# Patient Record
Sex: Male | Born: 1935 | Race: White | Hispanic: No | Marital: Married | Smoking: Current every day smoker
Health system: Southern US, Community
[De-identification: ages and names within clinical notes are randomized; demographics above are authoritative.]

## PROBLEM LIST (undated history)

## (undated) DIAGNOSIS — I2510 Atherosclerotic heart disease of native coronary artery without angina pectoris: Secondary | ICD-10-CM

## (undated) DIAGNOSIS — I739 Peripheral vascular disease, unspecified: Secondary | ICD-10-CM

## (undated) DIAGNOSIS — E785 Hyperlipidemia, unspecified: Secondary | ICD-10-CM

## (undated) HISTORY — DX: Peripheral vascular disease, unspecified: I73.9

## (undated) HISTORY — DX: Hyperlipidemia, unspecified: E78.5

---

## 2016-04-19 ENCOUNTER — Other Ambulatory Visit: Payer: Self-pay | Admitting: *Deleted

## 2016-04-19 ENCOUNTER — Other Ambulatory Visit: Payer: Self-pay | Admitting: Vascular Surgery

## 2016-04-19 DIAGNOSIS — L97419 Non-pressure chronic ulcer of right heel and midfoot with unspecified severity: Secondary | ICD-10-CM

## 2016-05-24 ENCOUNTER — Encounter: Payer: Self-pay | Admitting: Vascular Surgery

## 2016-06-02 ENCOUNTER — Ambulatory Visit (INDEPENDENT_AMBULATORY_CARE_PROVIDER_SITE_OTHER): Payer: Medicare Other | Admitting: Vascular Surgery

## 2016-06-02 ENCOUNTER — Other Ambulatory Visit: Payer: Self-pay | Admitting: *Deleted

## 2016-06-02 ENCOUNTER — Ambulatory Visit (HOSPITAL_COMMUNITY)
Admission: RE | Admit: 2016-06-02 | Discharge: 2016-06-02 | Disposition: A | Discharge status: Home / Self Care | Payer: Medicare Other | Source: Ambulatory Visit | Attending: Vascular Surgery | Admitting: Vascular Surgery

## 2016-06-02 ENCOUNTER — Encounter: Payer: Self-pay | Admitting: Vascular Surgery

## 2016-06-02 ENCOUNTER — Other Ambulatory Visit: Payer: Self-pay

## 2016-06-02 VITALS — BP 150/78 | HR 71 | Temp 98.3°F | Resp 20 | Ht 72.0 in | Wt 150.0 lb

## 2016-06-02 DIAGNOSIS — E785 Hyperlipidemia, unspecified: Secondary | ICD-10-CM | POA: Insufficient documentation

## 2016-06-02 DIAGNOSIS — L97419 Non-pressure chronic ulcer of right heel and midfoot with unspecified severity: Secondary | ICD-10-CM | POA: Diagnosis not present

## 2016-06-02 DIAGNOSIS — F17290 Nicotine dependence, other tobacco product, uncomplicated: Secondary | ICD-10-CM | POA: Insufficient documentation

## 2016-06-02 DIAGNOSIS — I739 Peripheral vascular disease, unspecified: Secondary | ICD-10-CM

## 2016-06-02 DIAGNOSIS — I7025 Atherosclerosis of native arteries of other extremities with ulceration: Secondary | ICD-10-CM

## 2016-06-02 DIAGNOSIS — I10 Essential (primary) hypertension: Secondary | ICD-10-CM | POA: Insufficient documentation

## 2016-06-02 NOTE — Progress Notes (Signed)
Vascular and Vein Specialist of [facility]  Patient name: Jesse Lucas MRN: [id number] DOB: 12/26/1934 Sex: male  REASON FOR VISIT: chronic right heel wound, referred by Dr. Charlynne Pander  HPI: Jesse Lucas is a 82 y.o. male, who presents with 18 month history of a non healing wound to the right heel. This started after the patient's wife was scrubbing his foot to rid of dead skin. The patient has been seeing Dr. Pricilla Holm, DPM for the past year and has been getting it cleaned every three months. The wound has improved but has never healed. He denies any pain with his wound due to constant numbness.   The patient that he has had constant numbness and weakness in his legs for the past two years. He does not know what brought this on. He has had what seems like nerve conduction studies in the past. He is mostly sedentary but does walk to the bathroom and around the house. His wife says he never leaves the house. He does report some pain with his right hip. He also reports some pain in his right foot at night.   He denies any history of CAD, although he has had chest pain in the past. He has undergone an treadmill stress test in the past. The patient has undergone Urolift for management of his lower urinary tract symptoms secondary to BPH. He is unaware of any renal issues. His last creatinine from April 2017 was 0.78.   He does not have diabetes. He started smoking since 9 and quit at 42, but now smokes from a pipe. He denies any history of CVA. He is currently on ciprofloxacin for a UTI. He takes a daily aspirin. He is on a statin for hyperlipidemia.   Past medical history: Prostate issues, hypertension, arthritis SOCIAL HISTORY: Social History   Social History  . Marital status: Married    Spouse name: N/A  . Number of children: N/A  . Years of education: N/A   Occupational History  . Not on file.   Social History Main Topics  . Smoking status: Current Every Day Smoker    Types: Pipe    . Smokeless tobacco: Former Neurosurgeon     Comment: 3-4 pipes per day  . Alcohol use Not on file  . Drug use: Unknown  . Sexual activity: Not on file   Other Topics Concern  . Not on file   Social History Narrative  . No narrative on file    No Known Allergies  Current Outpatient Prescriptions  Medication Sig Dispense Refill  . acetaminophen (TYLENOL) 325 MG tablet Take 650 mg by mouth every 6 (six) hours as needed.    Marland Kitchen aspirin 81 MG chewable tablet Chew by mouth daily.    Marland Kitchen atorvastatin (LIPITOR) 10 MG tablet Take 10 mg by mouth daily.    . ciprofloxacin (CIPRO) 500 MG tablet Take 500 mg by mouth 2 (two) times daily.    . cyanocobalamin 1000 MCG tablet Take 1,000 mcg by mouth daily.    . silver sulfADIAZINE (SILVADENE) 1 % cream Apply 1 application topically daily.     No current facility-administered medications for this visit.     REVIEW OF SYSTEMS:   denotes positive finding,  denotes negative finding Cardiac  Comments:  Chest pain or chest pressure:    Shortness of breath upon exertion:    Short of breath when lying flat:    Irregular heart rhythm:        Vascular  Pain in calf, thigh, or hip brought on by ambulation: x   Pain in feet at night that wakes you up from your sleep:  x   Blood clot in your veins:    Leg swelling:         Pulmonary    Oxygen at home:    Productive cough:     Wheezing:         Neurologic    Sudden weakness in arms or legs:     Sudden numbness in arms or legs:     Sudden onset of difficulty speaking or slurred speech:    Temporary loss of vision in one eye:     Problems with dizziness:         Gastrointestinal    Blood in stool:     Vomited blood:         Genitourinary    Burning when urinating:     Blood in urine:        Psychiatric    Major depression:         Hematologic    Bleeding problems:    Problems with blood clotting too easily:        Skin    Rashes or ulcers:        Constitutional    Fever or  chills:      PHYSICAL EXAM: Vitals:   06/02/16 1456  BP: (!) 150/78  Pulse: 71  Resp: 20  Temp: 98.3 F (36.8 C)  TempSrc: Oral  SpO2: 96%  Weight: 150 lb (68 kg)  Height: 6' (1.829 m)    GENERAL:The patient is a well-nourished male, in no acute distress. The vital signs are documented above. HEENT: normocephalic, no abnormalities.  CARDIAC: There is a regular rate and rhythm. No carotid bruits. VASCULAR: 2+ femoral pulses bilaterally. 1-2+ left popliteal and dorsalis pedis pulse. Non palpable right popliteal or pedal pulses.  PULMONARY: Non labored respiratory effort. Lungs clear bilaterally.  ABDOMEN: Soft and non-tender. No pulsatile mass.  MUSCULOSKELETAL: Kyphosis.  NEUROLOGIC: No focal deficits. Decreased sensation to feet bilaterally.  SKIN: 1.5 x 1.5 cm wound to medial right heel with callous. No bone visible. PSYCHIATRIC: The patient has a normal affect.  DATA:  ABIs 06/02/16  R: 0.75 with monophasic posterior tibial and dorsalis waveforms, TBI 0.59 L: 0.83 with monophasic posterior tibial and dorsalis pedis waveforms, TBI: 0.81   MEDICAL ISSUES: Non healing wound right heel  The patient has been going to the podiatrist for a year with minimal improvement in his wound. He does have some moderate PAD according to his ABIs. Discussed proceeding with an arteriogram and possible intervention. The procedure, risks and benefits were discussed with the patient and wife. Discussed that he may possibly need a bypass procedure if his disease is not amenable to percutaneous intervention. They are willing to proceed. This will be scheduled for tomorrow for 08/17/2016 with Dr. Darrick Penna. A BMP will be ordered preoperatively to evaluate his creatinine given no recent labs.  Maris Berger, PA-C [facility] Special[facility] of [place] [phone]   Clinic MD: Fields  History and exam details as above. Patient is a palpable left dorsalis pedis pulse is absent pedal pulses in the  right foot. He has a nonhealing wound has been present for almost 18 months. I believe he needs an arteriogram for further evaluation for wound healing potential as well as possible intervention. Risks benefits possible, patient and procedure details of aortogram lower extremity runoff possible intervention were  explained the patient and his wife today. These include but are not limited to bleeding infection vessel injury contrast reaction. They understand and agree to proceed.  Chronic medical problems of arthritis are stable. He does have a history of some type of prostate issue and may have some urinary dysfunction as stated above we will check his serum creatinine had a time to make sure that he will not be at risk of contrast nephropathy.  Fabienne Bruns, MD [facility] of [place] Office: [phone] Pager: [phone]

## 2016-06-03 ENCOUNTER — Ambulatory Visit (HOSPITAL_COMMUNITY)
Admission: RE | Admit: 2016-06-03 | Discharge: 2016-06-03 | Disposition: A | Discharge status: Home / Self Care | Payer: Medicare Other | Source: Ambulatory Visit | Attending: Vascular Surgery | Admitting: Vascular Surgery

## 2016-06-03 ENCOUNTER — Telehealth: Payer: Self-pay | Admitting: Vascular Surgery

## 2016-06-03 ENCOUNTER — Encounter (HOSPITAL_COMMUNITY)
Admission: RE | Disposition: A | Discharge status: Home / Self Care | Payer: Self-pay | Source: Ambulatory Visit | Attending: Vascular Surgery

## 2016-06-03 DIAGNOSIS — N401 Enlarged prostate with lower urinary tract symptoms: Secondary | ICD-10-CM | POA: Diagnosis not present

## 2016-06-03 DIAGNOSIS — E785 Hyperlipidemia, unspecified: Secondary | ICD-10-CM | POA: Diagnosis not present

## 2016-06-03 DIAGNOSIS — I700 Atherosclerosis of aorta: Secondary | ICD-10-CM | POA: Insufficient documentation

## 2016-06-03 DIAGNOSIS — L97419 Non-pressure chronic ulcer of right heel and midfoot with unspecified severity: Secondary | ICD-10-CM | POA: Diagnosis not present

## 2016-06-03 DIAGNOSIS — I739 Peripheral vascular disease, unspecified: Secondary | ICD-10-CM | POA: Diagnosis present

## 2016-06-03 DIAGNOSIS — I10 Essential (primary) hypertension: Secondary | ICD-10-CM | POA: Insufficient documentation

## 2016-06-03 DIAGNOSIS — M1990 Unspecified osteoarthritis, unspecified site: Secondary | ICD-10-CM | POA: Insufficient documentation

## 2016-06-03 DIAGNOSIS — I70202 Unspecified atherosclerosis of native arteries of extremities, left leg: Secondary | ICD-10-CM | POA: Diagnosis not present

## 2016-06-03 DIAGNOSIS — N390 Urinary tract infection, site not specified: Secondary | ICD-10-CM | POA: Diagnosis not present

## 2016-06-03 DIAGNOSIS — F17210 Nicotine dependence, cigarettes, uncomplicated: Secondary | ICD-10-CM | POA: Insufficient documentation

## 2016-06-03 DIAGNOSIS — I70234 Atherosclerosis of native arteries of right leg with ulceration of heel and midfoot: Secondary | ICD-10-CM | POA: Diagnosis not present

## 2016-06-03 DIAGNOSIS — Z7982 Long term (current) use of aspirin: Secondary | ICD-10-CM | POA: Diagnosis not present

## 2016-06-03 HISTORY — PX: ABDOMINAL AORTOGRAM W/LOWER EXTREMITY: CATH118223

## 2016-06-03 LAB — POCT I-STAT, CHEM 8
BUN: 26 mg/dL — ABNORMAL HIGH (ref 6–20)
CALCIUM ION: 1.19 mmol/L (ref 1.15–1.40)
CHLORIDE: 105 mmol/L (ref 101–111)
Creatinine, Ser: 0.9 mg/dL (ref 0.61–1.24)
GLUCOSE: 120 mg/dL — AB (ref 65–99)
HCT: 33 % — ABNORMAL LOW (ref 39.0–52.0)
Hemoglobin: 11.2 g/dL — ABNORMAL LOW (ref 13.0–17.0)
Potassium: 3.4 mmol/L — ABNORMAL LOW (ref 3.5–5.1)
Sodium: 142 mmol/L (ref 135–145)
TCO2: 27 mmol/L (ref 0–100)

## 2016-06-03 SURGERY — ABDOMINAL AORTOGRAM W/LOWER EXTREMITY
Anesthesia: LOCAL

## 2016-06-03 MED ORDER — LIDOCAINE HCL (PF) 1 % IJ SOLN
INTRAMUSCULAR | Status: AC
  Filled 2016-06-03: qty 30

## 2016-06-03 MED ORDER — IODIXANOL 320 MG/ML IV SOLN
INTRAVENOUS | Status: DC | PRN
  Administered 2016-06-03: 117 mL via INTRA_ARTERIAL

## 2016-06-03 MED ORDER — ACETAMINOPHEN 325 MG PO TABS: 325.0000 mg | ORAL_TABLET | ORAL | Status: DC | PRN

## 2016-06-03 MED ORDER — LABETALOL HCL 5 MG/ML IV SOLN: 10.0000 mg | INTRAVENOUS | Status: DC | PRN

## 2016-06-03 MED ORDER — HEPARIN (PORCINE) IN NACL 2-0.9 UNIT/ML-% IJ SOLN
INTRAMUSCULAR | Status: DC | PRN
  Administered 2016-06-03: 1000 mL

## 2016-06-03 MED ORDER — SODIUM CHLORIDE 0.45 % IV SOLN
INTRAVENOUS | Status: DC
  Administered 2016-06-03: 500 mL via INTRAVENOUS

## 2016-06-03 MED ORDER — MORPHINE SULFATE (PF) 10 MG/ML IV SOLN: 2.0000 mg | INTRAVENOUS | Status: DC | PRN

## 2016-06-03 MED ORDER — SODIUM CHLORIDE 0.9 % IV SOLN
INTRAVENOUS | Status: DC
  Administered 2016-06-03: 10:00:00 via INTRAVENOUS

## 2016-06-03 MED ORDER — ACETAMINOPHEN 325 MG RE SUPP
325.0000 mg | RECTAL | Status: DC | PRN
  Filled 2016-06-03: qty 2

## 2016-06-03 MED ORDER — SODIUM CHLORIDE 0.45 % IV SOLN: INTRAVENOUS | Status: AC

## 2016-06-03 MED ORDER — METOPROLOL TARTRATE 5 MG/5ML IV SOLN: 2.0000 mg | INTRAVENOUS | Status: DC | PRN

## 2016-06-03 MED ORDER — LIDOCAINE HCL (PF) 1 % IJ SOLN
INTRAMUSCULAR | Status: DC | PRN
  Administered 2016-06-03: 10 mL

## 2016-06-03 MED ORDER — SODIUM CHLORIDE 0.45 % IV SOLN: INTRAVENOUS | Status: DC

## 2016-06-03 MED ORDER — ONDANSETRON HCL 4 MG/2ML IJ SOLN: 4.0000 mg | Freq: Four times a day (QID) | INTRAMUSCULAR | Status: DC | PRN

## 2016-06-03 MED ORDER — HYDRALAZINE HCL 20 MG/ML IJ SOLN: 5.0000 mg | INTRAMUSCULAR | Status: DC | PRN

## 2016-06-03 SURGICAL SUPPLY — 9 items
CATH OMNI FLUSH 5F 65CM (CATHETERS) ×2 IMPLANT
COVER PRB 48X5XTLSCP FOLD TPE (BAG) ×1 IMPLANT
COVER PROBE 5X48 (BAG) ×1
KIT PV (KITS) ×2 IMPLANT
SHEATH PINNACLE 5F 10CM (SHEATH) ×2 IMPLANT
SYR MEDRAD MARK V 150ML (SYRINGE) ×2 IMPLANT
TRANSDUCER W/STOPCOCK (MISCELLANEOUS) ×2 IMPLANT
TRAY PV CATH (CUSTOM PROCEDURE TRAY) ×2 IMPLANT
WIRE HITORQ VERSACORE ST 145CM (WIRE) ×2 IMPLANT

## 2016-06-03 NOTE — Telephone Encounter (Signed)
-----   Message from Sharee Pimple, RN sent at 06/03/2016 12:59 PM EDT ----- Regarding: cardiac clearance and then appt with Dr. Darrick Penna   ----- Message ----- From: Sherren Kerns, MD Sent: 06/03/2016  11:16 AM To: Vvs Charge Pool  Procedure: Aortogram with bilateral extremity runoff  Pt needs cardiology eval prior to considering PT bypass right leg.  Please have him see me in the office after cardiology eval.   Fabienne Bruns, MD [facility] of [place] Office: [phone] Pager: [phone]

## 2016-06-03 NOTE — Progress Notes (Signed)
Sheath removed prior to coming to cath holding. Bed rest begins at 1130

## 2016-06-03 NOTE — Op Note (Addendum)
Procedure: Aortogram with bilateral extremity runoff  Preoperative diagnosis: Nonhealing wound right foot  Postoperative diagnosis: Same  Anesthesia: Local  Operative findings: #1 patent aortoiliac femoral popliteal system bilaterally. 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 The patient was placed in supine position on the Angio table. Both groins were prepped and draped in usual sterile fashion. Local anesthesia was ensured of the left common femoral artery. Ultrasound was used to identify the left common femoral artery and femoral bifurcation. An introducer needle was then used to cannulate the left common femoral artery using ultrasound guidance. An 035 versacore wire and threaded up in the abdominal aorta under fluoroscopic guidance. A 5 French sheath was passed over the guidewire and left common femoral artery. This was thoroughly flushed with heparinized saline. A 5 French Omni Flush catheter was then advanced over the guidewire into the abdominal aorta and abdominal aortogram was obtained in AP projection. The left and right renal arteries are widely patent. The infrarenal abdominal aorta is calcified but widely patent. The left and right common internal and external iliac arteries are widely patent. Next the Omni Flush catheter was pulled down just above the aortic bifurcation to confirm the above findings. This was done with magnification. Next bilateral extremity runoff views were performed through the Omni Flush catheter.  In the left lower extremity, the left common femoral profunda femoris superficial femoral popliteal arteries are all widely patent. The posterior tibial peroneal arteries are occluded. The anterior tibial artery is patent all the way to level of the foot although it does have  multiple segments of 50-70% stenosis in its midportion.  In the right lower extremity, the right common femoral profunda femoris and superficial femoral arteries are widely patent. The right popliteal artery is patent. The anterior tibial and peroneal arteries are occluded. The posterior tibial artery is patent but has a short segment about 5 cm area of occlusion in its midportion. It is the single runoff vessel to the right foot.  This point the Omni Flush catheter was removed over a guidewire. The 5 [place] sheathpain with direct pressure. The patient tolerated procedure well and there were no complications.  Operative management: The patient has a very superficial wound on his right foot. Consideration will be given for continued local wound care and observation versus right popliteal to posterior tibial artery bypass. Although the patient has anatomy suitable a possible retrograde procedures I do not feel that his foot is threatened to the point of considering this as it would not be as durable as a bypass.  We will schedule him for cardiac risk stratification. If his heart is in reasonable shape we will do a popliteal to posterior tibial bypass on the right leg. His heart does not seem reasonable for operation then we will consider a retrograde procedure or continued local wound care.  Fabienne Bruns, MD [facility] of [place] Office: [phone] Pager: [phone]

## 2016-06-03 NOTE — Discharge Instructions (Signed)
Femoral Site Care °Refer to this sheet in the next few weeks. These instructions provide you with information about caring for yourself after your procedure. Your health care provider may also give you more specific instructions. Your treatment has been planned according to current medical practices, but problems sometimes occur. Call your health care provider if you have any problems or questions after your procedure. °What can I expect after the procedure? °After your procedure, it is typical to have the following: °· Bruising at the site that usually fades within 1-2 weeks. °· Blood collecting in the tissue (hematoma) that may be painful to the touch. It should usually decrease in size and tenderness within 1-2 weeks. °Follow these instructions at home: °· Take medicines only as directed by your health care provider. °· You may shower 24-48 hours after the procedure or as directed by your health care provider. Remove the bandage (dressing) and gently wash the site with plain soap and water. Pat the area dry with a clean towel. Do not rub the site, because this may cause bleeding. °· Do not take baths, swim, or use a hot tub until your health care provider approves. °· Check your insertion site every day for redness, swelling, or drainage. °· Do not apply powder or lotion to the site. °· Limit use of stairs to twice a day for the first 2-3 days or as directed by your health care provider. °· Do not squat for the first 2-3 days or as directed by your health care provider. °· Do not lift over 10 lb (4.5 kg) for 5 days after your procedure or as directed by your health care provider. °· Ask your health care provider when it is okay to: °¨ Return to work or school. °¨ Resume usual physical activities or sports. °¨ Resume sexual activity. °· Do not drive home if you are discharged the same day as the procedure. Have someone else drive you. °· You may drive 24 hours after the procedure unless otherwise instructed by  your health care provider. °· Do not operate machinery or power tools for 24 hours after the procedure or as directed by your health care provider. °· If your procedure was done as an outpatient procedure, which means that you went home the same day as your procedure, a responsible adult should be with you for the first 24 hours after you arrive home. °· Keep all follow-up visits as directed by your health care provider. This is important. °Contact a health care provider if: °· You have a fever. °· You have chills. °· You have increased bleeding from the site. Hold pressure on the site. °Get help right away if: °· You have unusual pain at the site. °· You have redness, warmth, or swelling at the site. °· You have drainage (other than a small amount of blood on the dressing) from the site. °· The site is bleeding, and the bleeding does not stop after 30 minutes of holding steady pressure on the site. °· Your leg or foot becomes pale, cool, tingly, or numb. °This information is not intended to replace advice given to you by your health care provider. Make sure you discuss any questions you have with your health care provider. °Document Released: 10/18/2013 Document Revised: 07/23/2015 Document Reviewed: 09/03/2013 °Elsevier Interactive Patient Education © 2017 Else[employer] Inc[employer] ° °

## 2016-06-03 NOTE — Interval H&P Note (Signed)
History and Physical Interval Note:  06/03/2016 10:43 AM  Jesse Lucas  has presented today for surgery, with the diagnosis of nonhealing right heel ulcer  The various methods of treatment have been discussed with the patient and family. After consideration of risks, benefits and other options for treatment, the patient has consented to  Procedure(s): Abdominal Aortogram w/Lower Extremity (N/A) as a surgical intervention .  The patient's history has been reviewed, patient examined, no change in status, stable for surgery.  I have reviewed the patient's chart and labs.  Questions were answered to the patient's satisfaction.     Fabienne Bruns

## 2016-06-03 NOTE — Telephone Encounter (Signed)
Left message inquiring of the patient has an established cardiologist outside the [facility] system. If the patient doesn't, I will find him someone close to him to perform the cardiac clearance.

## 2016-06-03 NOTE — H&P (View-Only) (Signed)
 Vascular and Vein Specialist of [place]  Patient name: Jesse Lucas MRN: [id number] DOB: 12/06/1934 Sex: male  REASON FOR VISIT: chronic right heel wound, referred by Dr. Michael Joyce  HPI: Jesse Lucas is a 81 y.o. male, who presents with 18 month history of a non healing wound to the right heel. This started after the patient's wife was scrubbing his foot to rid of dead skin. The patient has been seeing Dr. Tucker, DPM for the past year and has been getting it cleaned every three months. The wound has improved but has never healed. He denies any pain with his wound due to constant numbness.   The patient that he has had constant numbness and weakness in his legs for the past two years. He does not know what brought this on. He has had what seems like nerve conduction studies in the past. He is mostly sedentary but does walk to the bathroom and around the house. His wife says he never leaves the house. He does report some pain with his right hip. He also reports some pain in his right foot at night.   He denies any history of CAD, although he has had chest pain in the past. He has undergone an treadmill stress test in the past. The patient has undergone Urolift for management of his lower urinary tract symptoms secondary to BPH. He is unaware of any renal issues. His last creatinine from April 2017 was 0.78.   He does not have diabetes. He started smoking since 9 and quit at 42, but now smokes from a pipe. He denies any history of CVA. He is currently on ciprofloxacin for a UTI. He takes a daily aspirin. He is on a statin for hyperlipidemia.   Past medical history: Prostate issues, hypertension, arthritis SOCIAL HISTORY: Social History   Social History  . Marital status: Married    Spouse name: N/A  . Number of children: N/A  . Years of education: N/A   Occupational History  . Not on file.   Social History Main Topics  . Smoking status: Current Every Day Smoker    Types: Pipe    . Smokeless tobacco: Former User     Comment: 3-4 pipes per day  . Alcohol use Not on file  . Drug use: Unknown  . Sexual activity: Not on file   Other Topics Concern  . Not on file   Social History Narrative  . No narrative on file    No Known Allergies  Current Outpatient Prescriptions  Medication Sig Dispense Refill  . acetaminophen (TYLENOL) 325 MG tablet Take 650 mg by mouth every 6 (six) hours as needed.    . aspirin 81 MG chewable tablet Chew by mouth daily.    . atorvastatin (LIPITOR) 10 MG tablet Take 10 mg by mouth daily.    . ciprofloxacin (CIPRO) 500 MG tablet Take 500 mg by mouth 2 (two) times daily.    . cyanocobalamin 1000 MCG tablet Take 1,000 mcg by mouth daily.    . silver sulfADIAZINE (SILVADENE) 1 % cream Apply 1 application topically daily.     No current facility-administered medications for this visit.     REVIEW OF SYSTEMS:  [X] denotes positive finding, [ ] denotes negative finding Cardiac  Comments:  Chest pain or chest pressure:    Shortness of breath upon exertion:    Short of breath when lying flat:    Irregular heart rhythm:        Vascular      Pain in calf, thigh, or hip brought on by ambulation: x   Pain in feet at night that wakes you up from your sleep:  x   Blood clot in your veins:    Leg swelling:         Pulmonary    Oxygen at home:    Productive cough:     Wheezing:         Neurologic    Sudden weakness in arms or legs:     Sudden numbness in arms or legs:     Sudden onset of difficulty speaking or slurred speech:    Temporary loss of vision in one eye:     Problems with dizziness:         Gastrointestinal    Blood in stool:     Vomited blood:         Genitourinary    Burning when urinating:     Blood in urine:        Psychiatric    Major depression:         Hematologic    Bleeding problems:    Problems with blood clotting too easily:        Skin    Rashes or ulcers:        Constitutional    Fever or  chills:      PHYSICAL EXAM: Vitals:   06/02/16 1456  BP: (!) 150/78  Pulse: 71  Resp: 20  Temp: 98.3 F (36.8 C)  TempSrc: Oral  SpO2: 96%  Weight: 150 lb (68 kg)  Height: 6' (1.829 m)    GENERAL:The patient is a well-nourished male, in no acute distress. The vital signs are documented above. HEENT: normocephalic, no abnormalities.  CARDIAC: There is a regular rate and rhythm. No carotid bruits. VASCULAR: 2+ femoral pulses bilaterally. 1-2+ left popliteal and dorsalis pedis pulse. Non palpable right popliteal or pedal pulses.  PULMONARY: Non labored respiratory effort. Lungs clear bilaterally.  ABDOMEN: Soft and non-tender. No pulsatile mass.  MUSCULOSKELETAL: Kyphosis.  NEUROLOGIC: No focal deficits. Decreased sensation to feet bilaterally.  SKIN: 1.5 x 1.5 cm wound to medial right heel with callous. No bone visible. PSYCHIATRIC: The patient has a normal affect.  DATA:  ABIs 06/02/16  R: 0.75 with monophasic posterior tibial and dorsalis waveforms, TBI 0.59 L: 0.83 with monophasic posterior tibial and dorsalis pedis waveforms, TBI: 0.81   MEDICAL ISSUES: Non healing wound right heel  The patient has been going to the podiatrist for a year with minimal improvement in his wound. He does have some moderate PAD according to his ABIs. Discussed proceeding with an arteriogram and possible intervention. The procedure, risks and benefits were discussed with the patient and wife. Discussed that he may possibly need a bypass procedure if his disease is not amenable to percutaneous intervention. They are willing to proceed. This will be scheduled for tomorrow for 08/17/2016 with Dr. Chosen Geske. A BMP will be ordered preoperatively to evaluate his creatinine given no recent labs.  Kimberly Trinh, PA-C [facility] [place] [phone]   Clinic MD: Tzipora Mcinroy  History and exam details as above. Patient is a palpable left dorsalis pedis pulse is absent pedal pulses in the  right foot. He has a nonhealing wound has been present for almost 18 months. I believe he needs an arteriogram for further evaluation for wound healing potential as well as possible intervention. Risks benefits possible, patient and procedure details of aortogram lower extremity runoff possible intervention were   explained the patient and his wife today. These include but are not limited to bleeding infection vessel injury contrast reaction. They understand and agree to proceed.  Chronic medical problems of arthritis are stable. He does have a history of some type of prostate issue and may have some urinary dysfunction as stated above we will check his serum creatinine had a time to make sure that he will not be at risk of contrast nephropathy.  Abhijay Morriss, MD [facility] of [place] Office: [phone] Pager: [phone]  

## 2016-06-06 ENCOUNTER — Encounter (HOSPITAL_COMMUNITY): Payer: Self-pay | Admitting: Vascular Surgery

## 2016-06-06 NOTE — Telephone Encounter (Signed)
Spoke to patient's wife- no established cardiologist. Created referral to [facility] in [place].

## 2016-06-22 ENCOUNTER — Ambulatory Visit (INDEPENDENT_AMBULATORY_CARE_PROVIDER_SITE_OTHER): Payer: Medicare Other | Admitting: Cardiology

## 2016-06-22 ENCOUNTER — Encounter: Payer: Self-pay | Admitting: Cardiology

## 2016-06-22 ENCOUNTER — Encounter: Payer: Self-pay | Admitting: *Deleted

## 2016-06-22 VITALS — BP 146/76 | HR 83 | Ht 71.0 in | Wt 150.6 lb

## 2016-06-22 DIAGNOSIS — Z01810 Encounter for preprocedural cardiovascular examination: Secondary | ICD-10-CM

## 2016-06-22 DIAGNOSIS — R0989 Other specified symptoms and signs involving the circulatory and respiratory systems: Secondary | ICD-10-CM

## 2016-06-22 DIAGNOSIS — R0602 Shortness of breath: Secondary | ICD-10-CM | POA: Diagnosis not present

## 2016-06-22 DIAGNOSIS — I7025 Atherosclerosis of native arteries of other extremities with ulceration: Secondary | ICD-10-CM

## 2016-06-22 NOTE — Progress Notes (Signed)
Clinical Summary Jesse Lucas is a 82 y.o.male seen as new patient, referred by Dr Jesse Lucas for preoperative evaluation.    1. Preoperative evaluation - no known cardiac - no chest pain. Has had some SOB. Sedentary lifestyle due to chronic leg pain/weakness. Fatigue with just taking a bath, chronic over 3-4 years. Limited to <1 block due to leg weakness. Cannot walk up flight of stairs.    2. PAD - followed by vascular - history of nonhealing right foot wound - being considered for lower extremity bypass vs cath intervention    PMH 1. PAD 2. Hyperlipidemia    No Known Allergies   Current Outpatient Prescriptions  Medication Sig Dispense Refill  . acetaminophen (TYLENOL) 325 MG tablet Take 650 mg by mouth every 6 (six) hours as needed.    Marland Kitchen aspirin 81 MG chewable tablet Chew by mouth daily.    Marland Kitchen atorvastatin (LIPITOR) 10 MG tablet Take 10 mg by mouth daily.    . cyanocobalamin 1000 MCG tablet Take 1,000 mcg by mouth daily.    . silver sulfADIAZINE (SILVADENE) 1 % cream Apply 1 application topically daily.     No current facility-administered medications for this visit.    Facility-Administered Medications Ordered in Other Visits  Medication Dose Route Frequency Provider Last Rate Last Dose  . technetium tetrofosmin (TC-MYOVIEW) injection 10 millicurie  10 millicurie Intravenous Once PRN Jesse Southward, MD      . technetium tetrofosmin (TC-MYOVIEW) injection 30 millicurie  30 millicurie Intravenous Once PRN Jesse Southward, MD         Past Surgical History:  Procedure Laterality Date  . ABDOMINAL AORTOGRAM W/LOWER EXTREMITY N/A 06/03/2016   Procedure: Abdominal Aortogram w/Lower Extremity;  Surgeon: Jesse Kerns, MD;  Location: [facility] INVASIVE CV LAB;  Service: Cardiovascular;  Laterality: N/A;     No Known Allergies    Family History  Problem Relation Age of Onset  . Diabetes Mother   . Cancer Mother     lymph node  . Heart disease Father   . Aneurysm Father     . Heart attack Brother      Social History Jesse Lucas reports that he has been smoking Pipe.  He has quit using smokeless tobacco. Jesse Lucas has no alcohol history on file.   Review of Systems CONSTITUTIONAL: No weight loss, fever, chills, weakness or fatigue.  HEENT: Eyes: No visual loss, blurred vision, double vision or yellow sclerae.No hearing loss, sneezing, congestion, runny nose or sore throat.  SKIN: No rash or itching.  CARDIOVASCULAR: per HPI RESPIRATORY: No shortness of breath, cough or sputum.  GASTROINTESTINAL: No anorexia, nausea, vomiting or diarrhea. No abdominal pain or blood.  GENITOURINARY: No burning on urination, no polyuria NEUROLOGICAL: No headache, dizziness, syncope, paralysis, ataxia, numbness or tingling in the extremities. No change in bowel or bladder control.  MUSCULOSKELETAL: No muscle, back pain, joint pain or stiffness.  LYMPHATICS: No enlarged nodes. No history of splenectomy.  PSYCHIATRIC: No history of depression or anxiety.  ENDOCRINOLOGIC: No reports of sweating, cold or heat intolerance. No polyuria or polydipsia.  Marland Kitchen   Physical Examination Vitals:   06/22/16 0923 06/22/16 0928  BP: (!) 170/69 (!) 146/76  Pulse: 85 83   Filed Weights   06/22/16 0923  Weight: 150 lb 9.6 oz (68.3 kg)    Gen: resting comfortably, no acute distress HEENT: no scleral icterus, pupils equal round and reactive, no palptable cervical adenopathy,  CV: RRR, 2/6 systolic murmur  RUSB,. No jvd. Bilatearl carotid bruits Resp: Clear to auscultation bilaterally GI: abdomen is soft, non-tender, non-distended, normal bowel sounds, no hepatosplenomegaly MSK: extremities are warm, no edema.  Skin: warm, no rash Neuro:  no focal deficits Psych: appropriate affect   Diagnostic Studies 05/2015 echo FINDINGS:  LEFT VENTRICLE The left ventricular size is normal. There is normal left ventricular wall  thickness. Upper septal hypertrophy (sigmoid septum), normal  variant.  Turbulence in the LVOT noted but no significant gradient. Left ventricular  systolic function is normal. LV ejection fraction = 55-60%. Left ventricular  filling pattern is impaired. No segmental wall motion abnormalities seen in  the left ventricle. -  RIGHT VENTRICLE The right ventricle is normal in size and function.  LEFT ATRIUM The left atrial size is normal.  RIGHT ATRIUM  Right atrial size is normal. - AORTIC VALVE The aortic valve is trileaflet. There is aortic valve sclerosis. There is no  aortic regurgitation. - MITRAL VALVE The mitral valve leaflets appear normal. There is trace mitral regurgitation. - TRICUSPID VALVE Structurally normal tricuspid valve. There is trace tricuspid regurgitation.  There was insufficient TR detected to calculate RV systolic pressure.  Estimated right atrial pressure is 5 mmHg.. - PULMONIC VALVE The pulmonic valve is not well visualized. Trace pulmonic valvular  regurgitation. - ARTERIES The aortic root is normal size. - VENOUS Pulmonary venous flow pattern is normal. The IVC is normal in size with an  inspiratory collapse of greater then 50%, suggesting normal right atrial  pressure. - EFFUSION There is no pericardial effusion.     Assessment and Plan  1. Preoperative evaluation - he is being considered for high risk vascular surgery. Poor function capacity, cannot tolerate greater than 4 METs due to SOB and leg pains - we will obtain a lexiscan MPI to further risk stratify   2. Carotid bruits - order cartoid [place].     F/u pending stress results Jesse Lucas, M.D.

## 2016-06-22 NOTE — Patient Instructions (Signed)
Your physician recommends that you schedule a follow-up appointment in: TO BE DETERMINED AFTER TESTING  Your physician recommends that you continue on your current medications as directed. Please refer to the Current Medication list given to you today.  Your physician has requested that you have a lexiscan myoview. For further information please visit [email or web address]. Please follow instruction sheet, as given.  Your physician has requested that you have a carotid duplex. This test is an ultrasound of the carotid arteries in your neck. It looks at blood flow through these arteries that supply the brain with blood. Allow one hour for this exam. There are no restrictions or special instructions.  Thank you for choosing [facility]!!    

## 2016-06-24 ENCOUNTER — Encounter (HOSPITAL_COMMUNITY)
Admission: RE | Admit: 2016-06-24 | Discharge: 2016-06-24 | Disposition: A | Discharge status: Home / Self Care | Payer: Medicare Other | Source: Ambulatory Visit | Attending: Cardiology | Admitting: Cardiology

## 2016-06-24 ENCOUNTER — Ambulatory Visit (HOSPITAL_COMMUNITY)
Admission: RE | Admit: 2016-06-24 | Discharge: 2016-06-24 | Disposition: A | Discharge status: Home / Self Care | Payer: Medicare Other | Source: Ambulatory Visit | Attending: Cardiology | Admitting: Cardiology

## 2016-06-24 ENCOUNTER — Ambulatory Visit (HOSPITAL_BASED_OUTPATIENT_CLINIC_OR_DEPARTMENT_OTHER)
Admission: RE | Admit: 2016-06-24 | Discharge: 2016-06-24 | Disposition: A | Discharge status: Home / Self Care | Payer: Medicare Other | Source: Ambulatory Visit | Attending: Cardiology | Admitting: Cardiology

## 2016-06-24 ENCOUNTER — Encounter (HOSPITAL_COMMUNITY): Payer: Self-pay

## 2016-06-24 DIAGNOSIS — R0602 Shortness of breath: Secondary | ICD-10-CM | POA: Insufficient documentation

## 2016-06-24 DIAGNOSIS — I6523 Occlusion and stenosis of bilateral carotid arteries: Secondary | ICD-10-CM | POA: Diagnosis not present

## 2016-06-24 DIAGNOSIS — R9439 Abnormal result of other cardiovascular function study: Secondary | ICD-10-CM | POA: Diagnosis not present

## 2016-06-24 DIAGNOSIS — R0989 Other specified symptoms and signs involving the circulatory and respiratory systems: Secondary | ICD-10-CM

## 2016-06-24 LAB — NM MYOCAR MULTI W/SPECT W/WALL MOTION / EF
CHL CUP NUCLEAR SDS: 4
CHL CUP NUCLEAR SRS: 17
LV dias vol: 85 mL (ref 62–150)
LVSYSVOL: 45 mL
Peak HR: 85 {beats}/min
RATE: 0.29
Rest HR: 61 {beats}/min
SSS: 21
TID: 0.92

## 2016-06-24 MED ORDER — REGADENOSON 0.4 MG/5ML IV SOLN
INTRAVENOUS | Status: AC
  Administered 2016-06-24: 0.4 mg via INTRAVENOUS
  Filled 2016-06-24: qty 5

## 2016-06-24 MED ORDER — SODIUM CHLORIDE 0.9% FLUSH
INTRAVENOUS | Status: AC
  Administered 2016-06-24: 10 mL via INTRAVENOUS
  Filled 2016-06-24: qty 10

## 2016-06-24 MED ORDER — TECHNETIUM TC 99M TETROFOSMIN IV KIT
30.0000 | PACK | Freq: Once | INTRAVENOUS | Status: AC | PRN
  Administered 2016-06-24: 30 via INTRAVENOUS

## 2016-06-24 MED ORDER — TECHNETIUM TC 99M TETROFOSMIN IV KIT
10.0000 | PACK | Freq: Once | INTRAVENOUS | Status: AC | PRN
  Administered 2016-06-24: 11 via INTRAVENOUS

## 2016-06-28 ENCOUNTER — Telehealth: Payer: Self-pay | Admitting: Vascular Surgery

## 2016-06-28 ENCOUNTER — Encounter: Payer: Self-pay | Admitting: *Deleted

## 2016-06-28 ENCOUNTER — Telehealth: Payer: Self-pay | Admitting: *Deleted

## 2016-06-28 NOTE — Telephone Encounter (Signed)
-----   Message from Sharee Pimple, RN sent at 06/28/2016  2:02 PM EDT ----- Regarding: needs appt Appt with Dr. Darrick Penna as soon as Dr. Allyson Sabal does cardiac workup ----- Message ----- From: Sherren Kerns, MD Sent: 06/28/2016   8:45 AM To: Vvs Charge Pool  Pt needs appt after cath  Leonette Most

## 2016-06-28 NOTE — Telephone Encounter (Signed)
-----   Message from Antoine Poche, MD sent at 06/27/2016  1:29 PM EDT ----- Stress test is abnormal, suggesting he has blocakges in the arteries of his heart. This may be causing some of his fatigue.  He needs to have a left heart cath done. Let him know this is similar to the recent procedure they did to look at the arteries of his legs, but instead will look at the arteries of his heart. We will forward result to his vascular surgeon as well.   Dina Rich MD

## 2016-06-28 NOTE — Telephone Encounter (Signed)
Sched appt 07/07/16 at 9:45. Spoke to pt to inform them of appt.

## 2016-06-28 NOTE — Telephone Encounter (Signed)
Pt agreeable and scheduled for 07/01/16 with Dr Herbie Baltimore at 1:30pm - pt voiced understanding of instructions - letter sent to pt

## 2016-06-29 ENCOUNTER — Other Ambulatory Visit: Payer: Self-pay | Admitting: Cardiology

## 2016-06-29 DIAGNOSIS — R0789 Other chest pain: Secondary | ICD-10-CM

## 2016-07-01 ENCOUNTER — Encounter (HOSPITAL_COMMUNITY)
Admission: RE | Disposition: A | Discharge status: Home / Self Care | Payer: Self-pay | Source: Ambulatory Visit | Attending: Cardiology

## 2016-07-01 ENCOUNTER — Ambulatory Visit (HOSPITAL_COMMUNITY)
Admission: RE | Admit: 2016-07-01 | Discharge: 2016-07-01 | Disposition: A | Discharge status: Home / Self Care | Payer: Medicare Other | Source: Ambulatory Visit | Attending: Cardiology | Admitting: Cardiology

## 2016-07-01 DIAGNOSIS — F17290 Nicotine dependence, other tobacco product, uncomplicated: Secondary | ICD-10-CM | POA: Insufficient documentation

## 2016-07-01 DIAGNOSIS — I2510 Atherosclerotic heart disease of native coronary artery without angina pectoris: Secondary | ICD-10-CM | POA: Insufficient documentation

## 2016-07-01 DIAGNOSIS — I2582 Chronic total occlusion of coronary artery: Secondary | ICD-10-CM | POA: Diagnosis not present

## 2016-07-01 DIAGNOSIS — Z01810 Encounter for preprocedural cardiovascular examination: Secondary | ICD-10-CM

## 2016-07-01 DIAGNOSIS — E785 Hyperlipidemia, unspecified: Secondary | ICD-10-CM | POA: Diagnosis not present

## 2016-07-01 DIAGNOSIS — I739 Peripheral vascular disease, unspecified: Secondary | ICD-10-CM | POA: Diagnosis not present

## 2016-07-01 DIAGNOSIS — R9439 Abnormal result of other cardiovascular function study: Secondary | ICD-10-CM | POA: Diagnosis present

## 2016-07-01 DIAGNOSIS — M79606 Pain in leg, unspecified: Secondary | ICD-10-CM | POA: Insufficient documentation

## 2016-07-01 DIAGNOSIS — I2584 Coronary atherosclerosis due to calcified coronary lesion: Secondary | ICD-10-CM | POA: Insufficient documentation

## 2016-07-01 DIAGNOSIS — Z7982 Long term (current) use of aspirin: Secondary | ICD-10-CM | POA: Diagnosis not present

## 2016-07-01 DIAGNOSIS — R0789 Other chest pain: Secondary | ICD-10-CM

## 2016-07-01 DIAGNOSIS — G8929 Other chronic pain: Secondary | ICD-10-CM | POA: Insufficient documentation

## 2016-07-01 HISTORY — PX: LEFT HEART CATH AND CORONARY ANGIOGRAPHY: CATH118249

## 2016-07-01 LAB — BASIC METABOLIC PANEL
Anion gap: 10 (ref 5–15)
BUN: 30 mg/dL — AB (ref 6–20)
CALCIUM: 9.1 mg/dL (ref 8.9–10.3)
CHLORIDE: 103 mmol/L (ref 101–111)
CO2: 28 mmol/L (ref 22–32)
CREATININE: 1.58 mg/dL — AB (ref 0.61–1.24)
GFR, EST AFRICAN AMERICAN: 46 mL/min — AB (ref >60)
GFR, EST NON AFRICAN AMERICAN: 40 mL/min — AB (ref >60)
Glucose, Bld: 112 mg/dL — ABNORMAL HIGH (ref 65–99)
Potassium: 3.7 mmol/L (ref 3.5–5.1)
SODIUM: 141 mmol/L (ref 135–145)

## 2016-07-01 LAB — CBC
HCT: 34.3 % — ABNORMAL LOW (ref 39.0–52.0)
Hemoglobin: 11.1 g/dL — ABNORMAL LOW (ref 13.0–17.0)
MCH: 29.3 pg (ref 26.0–34.0)
MCHC: 32.4 g/dL (ref 30.0–36.0)
MCV: 90.5 fL (ref 78.0–100.0)
PLATELETS: 216 10*3/uL (ref 150–400)
RBC: 3.79 MIL/uL — AB (ref 4.22–5.81)
RDW: 13.6 % (ref 11.5–15.5)
WBC: 6.9 10*3/uL (ref 4.0–10.5)

## 2016-07-01 LAB — PROTIME-INR
INR: 1.07
PROTHROMBIN TIME: 14 s (ref 11.4–15.2)

## 2016-07-01 SURGERY — LEFT HEART CATH AND CORONARY ANGIOGRAPHY
Anesthesia: LOCAL

## 2016-07-01 MED ORDER — VERAPAMIL HCL 2.5 MG/ML IV SOLN
INTRAVENOUS | Status: DC | PRN
  Administered 2016-07-01: 10 mL via INTRA_ARTERIAL

## 2016-07-01 MED ORDER — SODIUM CHLORIDE 0.9 % IV SOLN: INTRAVENOUS | Status: DC

## 2016-07-01 MED ORDER — SODIUM CHLORIDE 0.9% FLUSH: 3.0000 mL | INTRAVENOUS | Status: DC | PRN

## 2016-07-01 MED ORDER — HYDRALAZINE HCL 20 MG/ML IJ SOLN
INTRAMUSCULAR | Status: AC
  Filled 2016-07-01: qty 1

## 2016-07-01 MED ORDER — LIDOCAINE HCL (PF) 1 % IJ SOLN
INTRAMUSCULAR | Status: DC | PRN
  Administered 2016-07-01: 2 mL

## 2016-07-01 MED ORDER — HEPARIN (PORCINE) IN NACL 2-0.9 UNIT/ML-% IJ SOLN
INTRAMUSCULAR | Status: DC | PRN
  Administered 2016-07-01: 1000 mL

## 2016-07-01 MED ORDER — HEPARIN (PORCINE) IN NACL 2-0.9 UNIT/ML-% IJ SOLN
INTRAMUSCULAR | Status: AC
  Filled 2016-07-01: qty 500

## 2016-07-01 MED ORDER — IOPAMIDOL (ISOVUE-370) INJECTION 76%
INTRAVENOUS | Status: AC
  Filled 2016-07-01: qty 100

## 2016-07-01 MED ORDER — HEPARIN SODIUM (PORCINE) 1000 UNIT/ML IJ SOLN
INTRAMUSCULAR | Status: DC | PRN
  Administered 2016-07-01: 4000 [IU] via INTRAVENOUS

## 2016-07-01 MED ORDER — HYDRALAZINE HCL 20 MG/ML IJ SOLN
INTRAMUSCULAR | Status: DC | PRN
  Administered 2016-07-01 (×2): 10 mg via INTRAVENOUS

## 2016-07-01 MED ORDER — ASPIRIN 81 MG PO CHEW
CHEWABLE_TABLET | ORAL | Status: AC
  Administered 2016-07-01: 81 mg via ORAL
  Filled 2016-07-01: qty 1

## 2016-07-01 MED ORDER — SODIUM CHLORIDE 0.9% FLUSH: 3.0000 mL | Freq: Two times a day (BID) | INTRAVENOUS | Status: DC

## 2016-07-01 MED ORDER — SODIUM CHLORIDE 0.9 % IV SOLN
250.0000 mL | INTRAVENOUS | Status: DC | PRN
Start: 2016-07-01 — End: 2016-07-02

## 2016-07-01 MED ORDER — LIDOCAINE HCL (PF) 1 % IJ SOLN
INTRAMUSCULAR | Status: AC
  Filled 2016-07-01: qty 30

## 2016-07-01 MED ORDER — SODIUM CHLORIDE 0.9 % IV SOLN: 250.0000 mL | INTRAVENOUS | Status: DC | PRN

## 2016-07-01 MED ORDER — SODIUM CHLORIDE 0.9 % WEIGHT BASED INFUSION: 1.0000 mL/kg/h | INTRAVENOUS | Status: DC

## 2016-07-01 MED ORDER — VERAPAMIL HCL 2.5 MG/ML IV SOLN
INTRAVENOUS | Status: AC
  Filled 2016-07-01: qty 2

## 2016-07-01 MED ORDER — IOPAMIDOL (ISOVUE-370) INJECTION 76%
INTRAVENOUS | Status: DC | PRN
  Administered 2016-07-01: 60 mL via INTRA_ARTERIAL

## 2016-07-01 MED ORDER — HEPARIN SODIUM (PORCINE) 1000 UNIT/ML IJ SOLN
INTRAMUSCULAR | Status: AC
  Filled 2016-07-01: qty 1

## 2016-07-01 MED ORDER — ASPIRIN 81 MG PO CHEW
81.0000 mg | CHEWABLE_TABLET | ORAL | Status: AC
  Administered 2016-07-01: 81 mg via ORAL

## 2016-07-01 MED ORDER — SODIUM CHLORIDE 0.9 % WEIGHT BASED INFUSION
3.0000 mL/kg/h | INTRAVENOUS | Status: AC
  Administered 2016-07-01: 3 mL/kg/h via INTRAVENOUS

## 2016-07-01 MED ORDER — ONDANSETRON HCL 4 MG/2ML IJ SOLN: 4.0000 mg | Freq: Four times a day (QID) | INTRAMUSCULAR | Status: DC | PRN

## 2016-07-01 MED ORDER — HEPARIN (PORCINE) IN NACL 2-0.9 UNIT/ML-% IJ SOLN
INTRAMUSCULAR | Status: AC
  Filled 2016-07-01: qty 1000

## 2016-07-01 SURGICAL SUPPLY — 10 items
CATH INFINITI 5FR ANG PIGTAIL (CATHETERS) ×2 IMPLANT
CATH OPTITORQUE TIG 4.0 5F (CATHETERS) ×2 IMPLANT
DEVICE RAD COMP TR BAND LRG (VASCULAR PRODUCTS) ×2 IMPLANT
GLIDESHEATH SLEND A-KIT 6F 22G (SHEATH) ×2 IMPLANT
GUIDEWIRE INQWIRE 1.5J.035X260 (WIRE) ×1 IMPLANT
INQWIRE 1.5J .035X260CM (WIRE) ×2
KIT HEART LEFT (KITS) ×2 IMPLANT
PACK CARDIAC CATHETERIZATION (CUSTOM PROCEDURE TRAY) ×2 IMPLANT
TRANSDUCER W/STOPCOCK (MISCELLANEOUS) ×2 IMPLANT
TUBING CIL FLEX 10 FLL-RA (TUBING) ×2 IMPLANT

## 2016-07-01 NOTE — Brief Op Note (Signed)
   BRIEF CARDIAC CATHETERIZATION REPORT  Referring Cardiologist: Dr. Roderic Palau branch Vascular Surgeon: Dr. Juanda Crumble fields  07/01/2016.  6:49 PM  PATIENT:  Jesse Lucas  82 y.o. male with peripheral vascular disease who is an undergoing evaluation for possible per thoracic surgery. For preoperative evaluation had a cardiac stress test that was read as high risk. He now presents for invasive evaluation.  PRE-OPERATIVE DIAGNOSIS:  abnormal stress test  POST-OPERATIVE DIAGNOSIS:  Severe 2 vessel CAD with extensive severe lesions throughout the entire RCA mid to distal vessel and likely chronically occluded major OM 3 branches of the circumflex with bridging collaterals. Is also diffuse moderate disease in the LAD.  Elevated LVEDP of 22 mmHg in the setting of severe systemic hypertension.  PROCEDURE:  Procedure(s): Left Heart Cath and Coronary Angiography (N/A)   6 French right radial sheath placed using Angiocath micropuncture Kit.  TIG 4.0 catheter use for left and right coronary angiography as well as left ventriculography as well as hemodynamics.  Catheter was then removed without body over wire without palpitations  Sheath removed with TR been applied   SURGEON:  Surgeon(s) and Role:    * Leonie Man, MD - Primary  ANESTHESIA:   local lidocaine 3 mL  EBL:  <20 mL  MEDICATIONS USED: Radial cocktail (3 mg verapamil in 10 mL NS), IV hydralazine total of 20 mg   COUNTS:  YES  TR Band:  1845 hrs., 15 mL air.  DICTATION: .Note written in [place]: Discharge to home after PACU  PATIENT DISPOSITION:  PACU - hemodynamically stable.   Glenetta Hew, M.D., M.S. Interventional Cardiologist   Pager # [phone] Phone # [phone] [address] [place], [place] [address]

## 2016-07-01 NOTE — Interval H&P Note (Signed)
History and Physical Interval Note:  07/01/2016 5:57 PM  Jesse Lucas  has presented today for surgery, with the diagnosis of abnormal stress test.  The various methods of treatment have been discussed with the patient and family. After consideration of risks, benefits and other options for treatment, the patient has consented to  Procedure(s): Left Heart Cath and Coronary Angiography (N/A) as a surgical intervention .  The patient's history has been reviewed, patient examined, no change in status, stable for surgery.  I have reviewed the patient's chart and labs.  Questions were answered to the patient's satisfaction.    Cath Lab Visit (complete for each Cath Lab visit)  Clinical Evaluation Leading to the Procedure:   ACS: No.  Non-ACS:    Anginal Classification: No Symptoms; Pre-op for PV Operation  Anti-ischemic medical therapy: Minimal Therapy (1 class of medications)  Non-Invasive Test Results: High-risk stress test findings: cardiac mortality >3%/year  Prior CABG: No previous CABG    Jesse Lucas

## 2016-07-01 NOTE — H&P (View-Only) (Signed)
Clinical Summary Mr. Antuna is a 82 y.o.male seen as new patient, referred by Dr Darrick PennaFields for preoperative evaluation.    1. Preoperative evaluation - no known cardiac - no chest pain. Has had some SOB. Sedentary lifestyle due to chronic leg pain/weakness. Fatigue with just taking a bath, chronic over 3-4 years. Limited to <1 block due to leg weakness. Cannot walk up flight of stairs.    2. PAD - followed by vascular - history of nonhealing right foot wound - being considered for lower extremity bypass vs cath intervention    PMH 1. PAD 2. Hyperlipidemia    No Known Allergies   Current Outpatient Prescriptions  Medication Sig Dispense Refill  . acetaminophen (TYLENOL) 325 MG tablet Take 650 mg by mouth every 6 (six) hours as needed.    Marland Kitchen. aspirin 81 MG chewable tablet Chew by mouth daily.    Marland Kitchen. atorvastatin (LIPITOR) 10 MG tablet Take 10 mg by mouth daily.    . cyanocobalamin 1000 MCG tablet Take 1,000 mcg by mouth daily.    . silver sulfADIAZINE (SILVADENE) 1 % cream Apply 1 application topically daily.     No current facility-administered medications for this visit.    Facility-Administered Medications Ordered in Other Visits  Medication Dose Route Frequency Provider Last Rate Last Dose  . technetium tetrofosmin (TC-MYOVIEW) injection 10 millicurie  10 millicurie Intravenous Once PRN Ulyses SouthwardMark Boles, MD      . technetium tetrofosmin (TC-MYOVIEW) injection 30 millicurie  30 millicurie Intravenous Once PRN Ulyses SouthwardMark Boles, MD         Past Surgical History:  Procedure Laterality Date  . ABDOMINAL AORTOGRAM W/LOWER EXTREMITY N/A 06/03/2016   Procedure: Abdominal Aortogram w/Lower Extremity;  Surgeon: Sherren Kernsharles E Fields, MD;  [facility];  Service: Cardiovascular;  Laterality: N/A;     No Known Allergies    Family History  Problem Relation Age of Onset  . Diabetes Mother   . Cancer Mother     lymph node  . Heart disease Father   . Aneurysm Father     . Heart attack Brother      Social History Mr. Gittings reports that he has been smoking Pipe.  He has quit using smokeless tobacco. Mr. Raynelle Janriddy has no alcohol history on file.   Review of Systems CONSTITUTIONAL: No weight loss, fever, chills, weakness or fatigue.  HEENT: Eyes: No visual loss, blurred vision, double vision or yellow sclerae.No hearing loss, sneezing, congestion, runny nose or sore throat.  SKIN: No rash or itching.  CARDIOVASCULAR: per HPI RESPIRATORY: No shortness of breath, cough or sputum.  GASTROINTESTINAL: No anorexia, nausea, vomiting or diarrhea. No abdominal pain or blood.  GENITOURINARY: No burning on urination, no polyuria NEUROLOGICAL: No headache, dizziness, syncope, paralysis, ataxia, numbness or tingling in the extremities. No change in bowel or bladder control.  MUSCULOSKELETAL: No muscle, back pain, joint pain or stiffness.  LYMPHATICS: No enlarged nodes. No history of splenectomy.  PSYCHIATRIC: No history of depression or anxiety.  ENDOCRINOLOGIC: No reports of sweating, cold or heat intolerance. No polyuria or polydipsia.  Marland Kitchen.   Physical Examination Vitals:   06/22/16 0923 06/22/16 0928  BP: (!) 170/69 (!) 146/76  Pulse: 85 83   Filed Weights   06/22/16 0923  Weight: 150 lb 9.6 oz (68.3 kg)    Gen: resting comfortably, no acute distress HEENT: no scleral icterus, pupils equal round and reactive, no palptable cervical adenopathy,  CV: RRR, 2/6 systolic murmur  RUSB,. No jvd. Bilatearl carotid bruits Resp: Clear to auscultation bilaterally GI: abdomen is soft, non-tender, non-distended, normal bowel sounds, no hepatosplenomegaly MSK: extremities are warm, no edema.  Skin: warm, no rash Neuro:  no focal deficits Psych: appropriate affect   Diagnostic Studies 05/2015 echo FINDINGS:  LEFT VENTRICLE The left ventricular size is normal. There is normal left ventricular wall  thickness. Upper septal hypertrophy (sigmoid septum), normal  variant.  Turbulence in the LVOT noted but no significant gradient. Left ventricular  systolic function is normal. LV ejection fraction = 55-60%. Left ventricular  filling pattern is impaired. No segmental wall motion abnormalities seen in  the left ventricle. -  RIGHT VENTRICLE The right ventricle is normal in size and function.  LEFT ATRIUM The left atrial size is normal.  RIGHT ATRIUM  Right atrial size is normal. - AORTIC VALVE The aortic valve is trileaflet. There is aortic valve sclerosis. There is no  aortic regurgitation. - MITRAL VALVE The mitral valve leaflets appear normal. There is trace mitral regurgitation. - TRICUSPID VALVE Structurally normal tricuspid valve. There is trace tricuspid regurgitation.  There was insufficient TR detected to calculate RV systolic pressure.  Estimated right atrial pressure is 5 mmHg.. - PULMONIC VALVE The pulmonic valve is not well visualized. Trace pulmonic valvular  regurgitation. - ARTERIES The aortic root is normal size. - VENOUS Pulmonary venous flow pattern is normal. The IVC is normal in size with an  inspiratory collapse of greater then 50%, suggesting normal right atrial  pressure. - EFFUSION There is no pericardial effusion.     Assessment and Plan  1. Preoperative evaluation - he is being considered for high risk vascular surgery. Poor function capacity, cannot tolerate greater than 4 METs due to SOB and leg pains - we will obtain a lexiscan MPI to further risk stratify   2. Carotid bruits - order cartoid [place].     F/u pending stress results Antoine Poche, M.D.

## 2016-07-01 NOTE — Discharge Instructions (Signed)

## 2016-07-01 NOTE — Progress Notes (Signed)
Nigel MormonKim [place] / cath lab called, per Dr Herbie BaltimoreHarding pt to have increase to fluids as written. 75 cc increase to current  fluids till cath time// now running at 143 ml/hr.

## 2016-07-04 ENCOUNTER — Encounter (HOSPITAL_COMMUNITY): Payer: Self-pay | Admitting: Cardiology

## 2016-07-04 ENCOUNTER — Telehealth: Payer: Self-pay | Admitting: *Deleted

## 2016-07-04 NOTE — Telephone Encounter (Signed)
-----   Message from Antoine PocheJonathan F Branch, MD sent at 07/04/2016 11:38 AM EDT ----- Pt needs to f/u with me in 2 weeks   Dominga FerryJ Branch MD

## 2016-07-04 NOTE — Telephone Encounter (Signed)
Pt aware of 5/18 appt with Dr branch

## 2016-07-04 NOTE — Research (Signed)
OPTIMIZE Informed Consent   Subject Name: Jesse Lucas  Subject met inclusion and exclusion criteria.  The informed consent form, study requirements and expectations were reviewed with the subject and questions and concerns were addressed prior to the signing of the consent form.  The subject verbalized understanding of the trail requirements.  The subject agreed to participate in the OPTIMIZE trial and signed the informed consent.  The informed consent was obtained prior to performance of any protocol-specific procedures for the subject.  A copy of the signed informed consent was given to the subject and a copy was placed in the subject's medical record. Applicable if randomized.   Philemon Kingdom D Jul 01 2016, 1235pm

## 2016-07-07 ENCOUNTER — Ambulatory Visit: Payer: Medicare Other | Admitting: Vascular Surgery

## 2016-07-15 ENCOUNTER — Encounter: Payer: Self-pay | Admitting: Vascular Surgery

## 2016-07-15 ENCOUNTER — Ambulatory Visit (INDEPENDENT_AMBULATORY_CARE_PROVIDER_SITE_OTHER): Payer: Medicare Other | Admitting: Cardiology

## 2016-07-15 ENCOUNTER — Encounter: Payer: Self-pay | Admitting: Cardiology

## 2016-07-15 VITALS — BP 174/76 | HR 80 | Ht 68.0 in | Wt 145.8 lb

## 2016-07-15 DIAGNOSIS — Z01810 Encounter for preprocedural cardiovascular examination: Secondary | ICD-10-CM

## 2016-07-15 DIAGNOSIS — I7025 Atherosclerosis of native arteries of other extremities with ulceration: Secondary | ICD-10-CM

## 2016-07-15 DIAGNOSIS — I10 Essential (primary) hypertension: Secondary | ICD-10-CM

## 2016-07-15 DIAGNOSIS — I2510 Atherosclerotic heart disease of native coronary artery without angina pectoris: Secondary | ICD-10-CM

## 2016-07-15 DIAGNOSIS — N179 Acute kidney failure, unspecified: Secondary | ICD-10-CM | POA: Diagnosis not present

## 2016-07-15 MED ORDER — CARVEDILOL 3.125 MG PO TABS: 3.1250 mg | ORAL_TABLET | Freq: Two times a day (BID) | ORAL | 6 refills | Status: DC

## 2016-07-15 NOTE — Progress Notes (Signed)
Clinical Summary Mr. Hamon is a 82 y.o.male seen today for follow up of the following medical problems.    1. PAD - followed by vascular - history of nonhealing right foot wound - being considered for lower extremity bypass vs cath intervention   3. CAD - lexiscan 05/2016 large lateral defect with moderate ischemia - 06/2016 cath: diffuse LAD disease 50-60%, OM2 100%, RCA tandem 80% lesions, acute marginal 90% - ischemia from stress thought to be related to the occluded LCX/OM, not amenable to revasc though does have some collaterals. RCA poor target.    4. AKI - elevated Cr at time of recent cath - has not had repeat labs    PMH 1. PAD 2. Hyperlipidemia 3. CAD     Past Medical History:  Diagnosis Date  . Hyperlipidemia   . PAD (peripheral artery disease) (HCC)      No Known Allergies   Current Outpatient Prescriptions  Medication Sig Dispense Refill  . acetaminophen (TYLENOL) 500 MG tablet Take 500 mg by mouth daily as needed for moderate pain or headache.    Marland Kitchen. aspirin 81 MG chewable tablet Chew 81 mg by mouth daily.     . polyethylene glycol (MIRALAX / GLYCOLAX) packet Take 17 g by mouth daily as needed for mild constipation.     No current facility-administered medications for this visit.      Past Surgical History:  Procedure Laterality Date  . ABDOMINAL AORTOGRAM W/LOWER EXTREMITY N/A 06/03/2016   Procedure: Abdominal Aortogram w/Lower Extremity;  Surgeon: Sherren Kernsharles E Fields, MD;  [facility];  Service: Cardiovascular;  Laterality: N/A;  . LEFT HEART CATH AND CORONARY ANGIOGRAPHY N/A 07/01/2016   Procedure: Left Heart Cath and Coronary Angiography;  Surgeon: Marykay LexHarding, David W, MD;  [facility];  Service: Cardiovascular;  Laterality: N/A;     No Known Allergies    Family History  Problem Relation Age of Onset  . Diabetes Mother   . Cancer Mother        lymph node  . Heart disease Father   . Aneurysm Father    . Heart attack Brother      Social History Mr. Rehfeld reports that he has been smoking Pipe.  He has quit using smokeless tobacco. Mr. Raynelle Janriddy has no alcohol history on file.   Review of Systems CONSTITUTIONAL: No weight loss, fever, chills, weakness or fatigue.  HEENT: Eyes: No visual loss, blurred vision, double vision or yellow sclerae.No hearing loss, sneezing, congestion, runny nose or sore throat.  SKIN: No rash or itching.  CARDIOVASCULAR: no chest pain, no palpitations.  RESPIRATORY: No shortness of breath, cough or sputum.  GASTROINTESTINAL: No anorexia, nausea, vomiting or diarrhea. No abdominal pain or blood.  GENITOURINARY: No burning on urination, no polyuria NEUROLOGICAL: No headache, dizziness, syncope, paralysis, ataxia, numbness or tingling in the extremities. No change in bowel or bladder control.  MUSCULOSKELETAL: No muscle, back pain, joint pain or stiffness.  LYMPHATICS: No enlarged nodes. No history of splenectomy.  PSYCHIATRIC: No history of depression or anxiety.  ENDOCRINOLOGIC: No reports of sweating, cold or heat intolerance. No polyuria or polydipsia.  Marland Kitchen.   Physical Examination Vitals:   07/15/16 1136  BP: (!) 174/76  Pulse: 80   Vitals:   07/15/16 1136  Weight: 145 lb 12.8 oz (66.1 kg)  Height: 5\' 8"  (1.727 m)    Gen: resting comfortably, no acute distress HEENT: no scleral icterus, pupils equal round and reactive, no  palptable cervical adenopathy,  CV: RRR, no /mr/g, no jvd Resp: Clear to auscultation bilaterally GI: abdomen is soft, non-tender, non-distended, normal bowel sounds, no hepatosplenomegaly MSK: extremities are warm, no edema.  Skin: warm, no rash Neuro:  no focal deficits Psych: appropriate affect   Diagnostic Studies 05/2015 echo FINDINGS:  LEFT VENTRICLE The left ventricular size is normal. There is normal left ventricular wall  thickness. Upper septal hypertrophy (sigmoid septum), normal variant.  Turbulence in the  LVOT noted but no significant gradient. Left ventricular  systolic function is normal. LV ejection fraction = 55-60%. Left ventricular  filling pattern is impaired. No segmental wall motion abnormalities seen in  the left ventricle. -  RIGHT VENTRICLE The right ventricle is normal in size and function.  LEFT ATRIUM The left atrial size is normal.  RIGHT ATRIUM  Right atrial size is normal. - AORTIC VALVE The aortic valve is trileaflet. There is aortic valve sclerosis. There is no  aortic regurgitation. - MITRAL VALVE The mitral valve leaflets appear normal. There is trace mitral regurgitation. - TRICUSPID VALVE Structurally normal tricuspid valve. There is trace tricuspid regurgitation.  There was insufficient TR detected to calculate RV systolic pressure.  Estimated right atrial pressure is 5 mmHg.. - PULMONIC VALVE The pulmonic valve is not well visualized. Trace pulmonic valvular  regurgitation. - ARTERIES The aortic root is normal size. - VENOUS Pulmonary venous flow pattern is normal. The IVC is normal in size with an  inspiratory collapse of greater then 50%, suggesting normal right atrial  pressure. - EFFUSION There is no pericardial effusion.    05/2016 Lexiscan  No diagnostic ST segment changes to indicate ischemia. Rare PACs and PVCs noted.  Large, overall severe intensity, lateral wall defect from apex to base that exhibits partial reversibility particularly in the anterolateral distribution throughout and the inferolateral distribution at the apex. The mid to basal inferolateral wall is largely fixed. This is most consistent with scar with moderate peri-infarct ischemia.  This is a high risk study.  Nuclear stress EF: 47%.    Assessment and Plan   1. CAD - cath findings as reported above. Difficult anatomy for intervention, in absence of significant symptoms would not pursue. His anatomy nor his stress test qualify as high risk  - there is no  indication nor data to support improved outcomes for coronary intervention prior to upcoming surgery - if a catheter based intervention is plausible for his CAD would consider. If not, he is at increased risk but not prohibitive risk for vascular surgery. Will message vascular surgery.   2. HTN - elevated in clinic. Will start coreg 3.125mg  bid in setting of CAD  3. AKI - repeat BMET       Antoine Poche, M.D.

## 2016-07-15 NOTE — Patient Instructions (Signed)
Medication Instructions:   Begin Coreg 3.125mg  twice a day    Continue all other medications.    Labwork:  BMET - order given today.  Office will contact with results via phone or letter.    Testing/Procedures: none  Follow-Up: 3 months   Any Other Special Instructions Will Be Listed Below (If Applicable).  If you need a refill on your cardiac medications before your next appointment, please call your pharmacy.

## 2016-07-21 ENCOUNTER — Ambulatory Visit: Payer: Medicare Other | Admitting: Vascular Surgery

## 2016-08-01 ENCOUNTER — Encounter: Payer: Self-pay | Admitting: Vascular Surgery

## 2016-08-03 ENCOUNTER — Ambulatory Visit (INDEPENDENT_AMBULATORY_CARE_PROVIDER_SITE_OTHER): Payer: Medicare Other | Admitting: Vascular Surgery

## 2016-08-03 VITALS — BP 138/70 | HR 73 | Temp 97.2°F | Resp 18 | Ht 68.0 in | Wt 138.0 lb

## 2016-08-03 DIAGNOSIS — I7025 Atherosclerosis of native arteries of other extremities with ulceration: Secondary | ICD-10-CM

## 2016-08-03 DIAGNOSIS — I739 Peripheral vascular disease, unspecified: Secondary | ICD-10-CM

## 2016-08-03 NOTE — Progress Notes (Signed)
Vitals:   08/03/16 1432  BP: (!) 147/75  Pulse: 73  Resp: 18  Temp: 97.2 F (36.2 C)  SpO2: 97%  Weight: 138 lb (62.6 kg)  Height: 5\' 8"  (1.727 m)

## 2016-08-03 NOTE — Progress Notes (Signed)
Patient is a 82 year old male who returns for follow-up today. He has a history of a nonhealing wound of his right heel. He had an aortogram and lower extremity runoff performed April 2018. He had occlusion of the anterior tibial and peroneal arteries. He did reconstitute a distal right posterior tibial artery. In the left leg he had one vessel runoff via the Angio tibial artery. He returns for follow-up today after recent cardiology evaluation and reassessment of the wound in his foot.  He was recently evaluated by Dr. Herbie BaltimoreHarding from cardiology and thought to be low to moderate risk.  He has some weakness in the right leg he does not really describe rest pain. He is continuing to see his podiatrist for intermittent debridements.  Review of systems: He has shortness of breath with minimal exertion. He denies chest pain.  Physcal exam:  Vitals:   08/03/16 1432 08/03/16 1437  BP: (!) 147/75 138/70  Pulse: 73 73  Resp: 18   Temp: 97.2 F (36.2 C)   SpO2: 97%   Weight: 138 lb (62.6 kg)   Height: 5\' 8"  (1.727 m)     Extremities: Right foot 5 mm x 5 mm x 2 mm depth right inner heel ulcer  Data: Recent ABIs prior to his angiogram were 0.75 on the right 0.83 on the left monophasic waveforms toe pressure of 95 on the right 131 on the left  Assessment: Nonhealing wound right leg. Cardiac risk stratification shows he is low to moderate risk for myocardial events in the event of an operation. The wound has not healed after several months of local wound care but has not dramatically progressed either. I offered the patient a popliteal to posterior tibial bypass today. Risks benefits possible complications and procedure details of the operation were discussed with him and his family. These include but are not limited to bleeding infection wound issues graft thrombosis possible limb loss possible myocardial events 5%. He wishes to think about whether or not he wants to proceed with operation. They will call me  if they wish to schedule this in the near future.  Jesse Brunsharles Vasily Fedewa, MD [facility] Office: [phone] Pager: [phone]

## 2016-10-14 ENCOUNTER — Other Ambulatory Visit: Payer: Self-pay | Admitting: Cardiology

## 2016-10-20 ENCOUNTER — Ambulatory Visit: Payer: Medicare Other | Admitting: Cardiology

## 2016-11-30 ENCOUNTER — Ambulatory Visit: Payer: Medicare Other | Admitting: Cardiology

## 2016-11-30 NOTE — Progress Notes (Deleted)
Clinical Summary [name] is a 82 y.o.male seen today for follow up of the following medical problems.    1. PAD - followed by vascular - history of nonhealing right foot wound - being considered for lower extremity bypass vs cath intervention   3. CAD - lexiscan 05/2016 large lateral defect with moderate ischemia - 06/2016 cath: diffuse LAD disease 50-60%, OM2 100%, RCA tandem 80% lesions, acute marginal 90% - ischemia from stress thought to be related to the occluded LCX/OM, not amenable to revasc though does have some collaterals. RCA poor target.    3. AKI - elevated Cr at time of recent cath - has not had repeat labs    PMH 1. PAD 2. Hyperlipidemia 3. CAD   Past Medical History:  Diagnosis Date  . Hyperlipidemia   . PAD (peripheral artery disease) (HCC)      No Known Allergies   Current Outpatient Prescriptions  Medication Sig Dispense Refill  . acetaminophen (TYLENOL) 500 MG tablet Take 500 mg by mouth daily as needed for moderate pain or headache.    Marland Kitchen aspirin 81 MG chewable tablet Chew 81 mg by mouth daily.     . carvedilol (COREG) 3.125 MG tablet TAKE 1 TABLET BY MOUTH TWICE DAILY 180 tablet 1  . polyethylene glycol (MIRALAX / GLYCOLAX) packet Take 17 g by mouth daily as needed for mild constipation.    . rosuvastatin (CRESTOR) 40 MG tablet Crestor 40 mg tablet  Take 1 tablet every day by oral route.     No current facility-administered medications for this visit.      Past Surgical History:  Procedure Laterality Date  . ABDOMINAL AORTOGRAM W/LOWER EXTREMITY N/A 06/03/2016   Procedure: Abdominal Aortogram w/Lower Extremity;  Surgeon: Sherren Kerns, MD;  Location: [facility] INVASIVE CV LAB;  Service: Cardiovascular;  Laterality: N/A;  . LEFT HEART CATH AND CORONARY ANGIOGRAPHY N/A 07/01/2016   Procedure: Left Heart Cath and Coronary Angiography;  Surgeon: Marykay Lex, MD;  Location: [facility] INVASIVE CV LAB;  Service: Cardiovascular;   Laterality: N/A;     No Known Allergies    Family History  Problem Relation Age of Onset  . Diabetes Mother   . Cancer Mother        lymph node  . Heart disease Father   . Aneurysm Father   . Heart attack Brother      Social History Jesse Lucas reports that he has been smoking Pipe.  He has quit using smokeless tobacco. Jesse Lucas has no alcohol history on file.   Review of Systems CONSTITUTIONAL: No weight loss, fever, chills, weakness or fatigue.  HEENT: Eyes: No visual loss, blurred vision, double vision or yellow sclerae.No hearing loss, sneezing, congestion, runny nose or sore throat.  SKIN: No rash or itching.  CARDIOVASCULAR:  RESPIRATORY: No shortness of breath, cough or sputum.  GASTROINTESTINAL: No anorexia, nausea, vomiting or diarrhea. No abdominal pain or blood.  GENITOURINARY: No burning on urination, no polyuria NEUROLOGICAL: No headache, dizziness, syncope, paralysis, ataxia, numbness or tingling in the extremities. No change in bowel or bladder control.  MUSCULOSKELETAL: No muscle, back pain, joint pain or stiffness.  LYMPHATICS: No enlarged nodes. No history of splenectomy.  PSYCHIATRIC: No history of depression or anxiety.  ENDOCRINOLOGIC: No reports of sweating, cold or heat intolerance. No polyuria or polydipsia.  Marland Kitchen   Physical Examination There were no vitals filed for this visit. There were no vitals filed for this visit.  Gen: resting comfortably,  no acute distress HEENT: no scleral icterus, pupils equal round and reactive, no palptable cervical adenopathy,  CV Resp: Clear to auscultation bilaterally GI: abdomen is soft, non-tender, non-distended, normal bowel sounds, no hepatosplenomegaly MSK: extremities are warm, no edema.  Skin: warm, no rash Neuro:  no focal deficits Psych: appropriate affect   Diagnostic Studies 05/2015 echo FINDINGS:  LEFT VENTRICLE The left ventricular size is normal. There is normal left ventricular wall    thickness. Upper septal hypertrophy (sigmoid septum), normal variant.  Turbulence in the LVOT noted but no significant gradient. Left ventricular  systolic function is normal. LV ejection fraction = 55-60%. Left ventricular  filling pattern is impaired. No segmental wall motion abnormalities seen in  the left ventricle. -  RIGHT VENTRICLE The right ventricle is normal in size and function.  LEFT ATRIUM The left atrial size is normal.  RIGHT ATRIUM  Right atrial size is normal. - AORTIC VALVE The aortic valve is trileaflet. There is aortic valve sclerosis. There is no  aortic regurgitation. - MITRAL VALVE The mitral valve leaflets appear normal. There is trace mitral regurgitation. - TRICUSPID VALVE Structurally normal tricuspid valve. There is trace tricuspid regurgitation.  There was insufficient TR detected to calculate RV systolic pressure.  Estimated right atrial pressure is 5 mmHg.. - PULMONIC VALVE The pulmonic valve is not well visualized. Trace pulmonic valvular  regurgitation. - ARTERIES The aortic root is normal size. - VENOUS Pulmonary venous flow pattern is normal. The IVC is normal in size with an  inspiratory collapse of greater then 50%, suggesting normal right atrial  pressure. - EFFUSION There is no pericardial effusion.    05/2016 Lexiscan  No diagnostic ST segment changes to indicate ischemia. Rare PACs and PVCs noted.  Large, overall severe intensity, lateral wall defect from apex to base that exhibits partial reversibility particularly in the anterolateral distribution throughout and the inferolateral distribution at the apex. The mid to basal inferolateral wall is largely fixed. This is most consistent with scar with moderate peri-infarct ischemia.  This is a high risk study.  Nuclear stress EF: 47%.    Assessment and Plan  1. CAD - cath findings as reported above. Difficult anatomy for intervention, in absence of significant  symptoms would not pursue. His anatomy nor his stress test qualify as high risk  - there is no indication nor data to support improved outcomes for coronary intervention prior to upcoming surgery - if a catheter based intervention is plausible for his CAD would consider. If not, he is at increased risk but not prohibitive risk for vascular surgery. Will message vascular surgery.   2. HTN - elevated in clinic. Will start coreg 3.125mg  bid in setting of CAD  3. AKI - repeat BMET       Antoine Poche, M.D., F.A.C.C.

## 2016-12-07 ENCOUNTER — Emergency Department (HOSPITAL_COMMUNITY): Payer: Medicare Other

## 2016-12-07 ENCOUNTER — Encounter (HOSPITAL_COMMUNITY): Payer: Self-pay

## 2016-12-07 ENCOUNTER — Inpatient Hospital Stay (HOSPITAL_COMMUNITY)
Admission: EM | Admit: 2016-12-07 | Discharge: 2016-12-12 | DRG: 853 | Disposition: A | Discharge status: Skilled Nursing Facility | Payer: Medicare Other | Attending: Internal Medicine | Admitting: Internal Medicine

## 2016-12-07 DIAGNOSIS — Z6820 Body mass index (BMI) 20.0-20.9, adult: Secondary | ICD-10-CM

## 2016-12-07 DIAGNOSIS — L89150 Pressure ulcer of sacral region, unstageable: Secondary | ICD-10-CM | POA: Diagnosis present

## 2016-12-07 DIAGNOSIS — L89324 Pressure ulcer of left buttock, stage 4: Secondary | ICD-10-CM | POA: Diagnosis present

## 2016-12-07 DIAGNOSIS — E1151 Type 2 diabetes mellitus with diabetic peripheral angiopathy without gangrene: Secondary | ICD-10-CM | POA: Diagnosis present

## 2016-12-07 DIAGNOSIS — Z833 Family history of diabetes mellitus: Secondary | ICD-10-CM | POA: Diagnosis not present

## 2016-12-07 DIAGNOSIS — N179 Acute kidney failure, unspecified: Secondary | ICD-10-CM | POA: Diagnosis present

## 2016-12-07 DIAGNOSIS — I739 Peripheral vascular disease, unspecified: Secondary | ICD-10-CM | POA: Diagnosis present

## 2016-12-07 DIAGNOSIS — E860 Dehydration: Secondary | ICD-10-CM | POA: Diagnosis present

## 2016-12-07 DIAGNOSIS — A419 Sepsis, unspecified organism: Secondary | ICD-10-CM | POA: Diagnosis present

## 2016-12-07 DIAGNOSIS — L03317 Cellulitis of buttock: Secondary | ICD-10-CM

## 2016-12-07 DIAGNOSIS — Z8249 Family history of ischemic heart disease and other diseases of the circulatory system: Secondary | ICD-10-CM | POA: Diagnosis not present

## 2016-12-07 DIAGNOSIS — E43 Unspecified severe protein-calorie malnutrition: Secondary | ICD-10-CM | POA: Diagnosis present

## 2016-12-07 DIAGNOSIS — D649 Anemia, unspecified: Secondary | ICD-10-CM | POA: Diagnosis not present

## 2016-12-07 DIAGNOSIS — I2510 Atherosclerotic heart disease of native coronary artery without angina pectoris: Secondary | ICD-10-CM | POA: Diagnosis present

## 2016-12-07 DIAGNOSIS — L89323 Pressure ulcer of left buttock, stage 3: Secondary | ICD-10-CM

## 2016-12-07 DIAGNOSIS — R531 Weakness: Secondary | ICD-10-CM | POA: Diagnosis present

## 2016-12-07 DIAGNOSIS — D509 Iron deficiency anemia, unspecified: Secondary | ICD-10-CM | POA: Diagnosis present

## 2016-12-07 DIAGNOSIS — E785 Hyperlipidemia, unspecified: Secondary | ICD-10-CM | POA: Diagnosis present

## 2016-12-07 DIAGNOSIS — F17290 Nicotine dependence, other tobacco product, uncomplicated: Secondary | ICD-10-CM | POA: Diagnosis present

## 2016-12-07 DIAGNOSIS — F17200 Nicotine dependence, unspecified, uncomplicated: Secondary | ICD-10-CM | POA: Diagnosis present

## 2016-12-07 DIAGNOSIS — E119 Type 2 diabetes mellitus without complications: Secondary | ICD-10-CM

## 2016-12-07 DIAGNOSIS — D62 Acute posthemorrhagic anemia: Secondary | ICD-10-CM | POA: Diagnosis present

## 2016-12-07 HISTORY — DX: Atherosclerotic heart disease of native coronary artery without angina pectoris: I25.10

## 2016-12-07 LAB — CBC WITH DIFFERENTIAL/PLATELET
BASOS ABS: 0 10*3/uL (ref 0.0–0.1)
BASOS PCT: 0 %
EOS ABS: 0 10*3/uL (ref 0.0–0.7)
Eosinophils Relative: 0 %
HEMATOCRIT: 29.5 % — AB (ref 39.0–52.0)
HEMOGLOBIN: 9.6 g/dL — AB (ref 13.0–17.0)
Lymphocytes Relative: 10 %
Lymphs Abs: 1.2 10*3/uL (ref 0.7–4.0)
MCH: 29.4 pg (ref 26.0–34.0)
MCHC: 32.5 g/dL (ref 30.0–36.0)
MCV: 90.5 fL (ref 78.0–100.0)
Monocytes Absolute: 0.6 10*3/uL (ref 0.1–1.0)
Monocytes Relative: 5 %
NEUTROS ABS: 9.6 10*3/uL — AB (ref 1.7–7.7)
NEUTROS PCT: 85 %
Platelets: 358 10*3/uL (ref 150–400)
RBC: 3.26 MIL/uL — AB (ref 4.22–5.81)
RDW: 13.3 % (ref 11.5–15.5)
WBC: 11.4 10*3/uL — AB (ref 4.0–10.5)

## 2016-12-07 LAB — COMPREHENSIVE METABOLIC PANEL
ALK PHOS: 125 U/L (ref 38–126)
ALT: 45 U/L (ref 17–63)
ANION GAP: 9 (ref 5–15)
AST: 40 U/L (ref 15–41)
Albumin: 2 g/dL — ABNORMAL LOW (ref 3.5–5.0)
BILIRUBIN TOTAL: 0.2 mg/dL — AB (ref 0.3–1.2)
BUN: 41 mg/dL — ABNORMAL HIGH (ref 6–20)
CALCIUM: 8.4 mg/dL — AB (ref 8.9–10.3)
CO2: 27 mmol/L (ref 22–32)
CREATININE: 1.34 mg/dL — AB (ref 0.61–1.24)
Chloride: 97 mmol/L — ABNORMAL LOW (ref 101–111)
GFR, EST AFRICAN AMERICAN: 56 mL/min — AB (ref >60)
GFR, EST NON AFRICAN AMERICAN: 49 mL/min — AB (ref >60)
Glucose, Bld: 177 mg/dL — ABNORMAL HIGH (ref 65–99)
Potassium: 3.5 mmol/L (ref 3.5–5.1)
SODIUM: 133 mmol/L — AB (ref 135–145)
TOTAL PROTEIN: 6.7 g/dL (ref 6.5–8.1)

## 2016-12-07 LAB — GLUCOSE, CAPILLARY: GLUCOSE-CAPILLARY: 182 mg/dL — AB (ref 65–99)

## 2016-12-07 LAB — POC OCCULT BLOOD, ED: FECAL OCCULT BLD: NEGATIVE

## 2016-12-07 LAB — PROTIME-INR
INR: 1.18
PROTHROMBIN TIME: 14.9 s (ref 11.4–15.2)

## 2016-12-07 LAB — LACTIC ACID, PLASMA: Lactic Acid, Venous: 1 mmol/L (ref 0.5–1.9)

## 2016-12-07 LAB — PROCALCITONIN: PROCALCITONIN: 0.51 ng/mL

## 2016-12-07 LAB — I-STAT CG4 LACTIC ACID, ED: Lactic Acid, Venous: 2.4 mmol/L (ref 0.5–1.9)

## 2016-12-07 MED ORDER — INSULIN ASPART 100 UNIT/ML ~~LOC~~ SOLN
0.0000 [IU] | Freq: Three times a day (TID) | SUBCUTANEOUS | Status: DC
  Administered 2016-12-08 – 2016-12-09 (×4): 2 [IU] via SUBCUTANEOUS
  Administered 2016-12-10: 1 [IU] via SUBCUTANEOUS
  Administered 2016-12-10 – 2016-12-12 (×4): 2 [IU] via SUBCUTANEOUS

## 2016-12-07 MED ORDER — CARVEDILOL 3.125 MG PO TABS
3.1250 mg | ORAL_TABLET | Freq: Two times a day (BID) | ORAL | Status: DC
  Administered 2016-12-07 – 2016-12-12 (×8): 3.125 mg via ORAL
  Filled 2016-12-07 (×8): qty 1

## 2016-12-07 MED ORDER — ASPIRIN 81 MG PO CHEW
81.0000 mg | CHEWABLE_TABLET | Freq: Every day | ORAL | Status: DC
  Administered 2016-12-08 – 2016-12-12 (×5): 81 mg via ORAL
  Filled 2016-12-07 (×5): qty 1

## 2016-12-07 MED ORDER — ONDANSETRON HCL 4 MG/2ML IJ SOLN: 4.0000 mg | Freq: Four times a day (QID) | INTRAMUSCULAR | Status: DC | PRN

## 2016-12-07 MED ORDER — ONDANSETRON HCL 4 MG PO TABS: 4.0000 mg | ORAL_TABLET | Freq: Four times a day (QID) | ORAL | Status: DC | PRN

## 2016-12-07 MED ORDER — ENSURE ENLIVE PO LIQD
237.0000 mL | Freq: Two times a day (BID) | ORAL | Status: DC
  Administered 2016-12-08 – 2016-12-12 (×3): 237 mL via ORAL

## 2016-12-07 MED ORDER — ENOXAPARIN SODIUM 40 MG/0.4ML ~~LOC~~ SOLN
40.0000 mg | SUBCUTANEOUS | Status: DC
  Administered 2016-12-07 – 2016-12-11 (×5): 40 mg via SUBCUTANEOUS
  Filled 2016-12-07 (×5): qty 0.4

## 2016-12-07 MED ORDER — ROSUVASTATIN CALCIUM 20 MG PO TABS
40.0000 mg | ORAL_TABLET | Freq: Every day | ORAL | Status: DC
  Administered 2016-12-07 – 2016-12-11 (×4): 40 mg via ORAL
  Filled 2016-12-07 (×5): qty 2

## 2016-12-07 MED ORDER — LACTATED RINGERS IV SOLN
INTRAVENOUS | Status: DC
  Administered 2016-12-07: 22:00:00 via INTRAVENOUS

## 2016-12-07 MED ORDER — ACETAMINOPHEN 650 MG RE SUPP: 650.0000 mg | Freq: Four times a day (QID) | RECTAL | Status: DC | PRN

## 2016-12-07 MED ORDER — INSULIN ASPART 100 UNIT/ML ~~LOC~~ SOLN
0.0000 [IU] | Freq: Every day | SUBCUTANEOUS | Status: DC
  Administered 2016-12-08: 2 [IU] via SUBCUTANEOUS

## 2016-12-07 MED ORDER — SODIUM CHLORIDE 0.9 % IV BOLUS (SEPSIS)
1000.0000 mL | Freq: Once | INTRAVENOUS | Status: AC
  Administered 2016-12-07 (×2): 1000 mL via INTRAVENOUS

## 2016-12-07 MED ORDER — VANCOMYCIN HCL IN DEXTROSE 1-5 GM/200ML-% IV SOLN
1000.0000 mg | Freq: Once | INTRAVENOUS | Status: AC
  Administered 2016-12-07: 1000 mg via INTRAVENOUS
  Filled 2016-12-07: qty 200

## 2016-12-07 MED ORDER — PIPERACILLIN-TAZOBACTAM 3.375 G IVPB 30 MIN
3.3750 g | Freq: Once | INTRAVENOUS | Status: AC
  Administered 2016-12-07: 3.375 g via INTRAVENOUS
  Filled 2016-12-07: qty 50

## 2016-12-07 MED ORDER — PIPERACILLIN-TAZOBACTAM 3.375 G IVPB
3.3750 g | Freq: Three times a day (TID) | INTRAVENOUS | Status: DC
  Administered 2016-12-07 – 2016-12-12 (×14): 3.375 g via INTRAVENOUS
  Filled 2016-12-07 (×14): qty 50

## 2016-12-07 MED ORDER — ACETAMINOPHEN 325 MG PO TABS: 650.0000 mg | ORAL_TABLET | Freq: Four times a day (QID) | ORAL | Status: DC | PRN

## 2016-12-07 MED ORDER — SILVER SULFADIAZINE 1 % EX CREA
TOPICAL_CREAM | Freq: Two times a day (BID) | CUTANEOUS | Status: DC
  Administered 2016-12-07 – 2016-12-08 (×2): via TOPICAL
  Administered 2016-12-09: 1 via TOPICAL
  Administered 2016-12-09 – 2016-12-12 (×6): via TOPICAL
  Filled 2016-12-07: qty 85

## 2016-12-07 MED ORDER — DOCUSATE SODIUM 100 MG PO CAPS
100.0000 mg | ORAL_CAPSULE | Freq: Two times a day (BID) | ORAL | Status: DC
  Administered 2016-12-07 – 2016-12-12 (×10): 100 mg via ORAL
  Filled 2016-12-07 (×10): qty 1

## 2016-12-07 MED ORDER — POLYETHYLENE GLYCOL 3350 17 G PO PACK: 17.0000 g | PACK | Freq: Every day | ORAL | Status: DC | PRN

## 2016-12-07 MED ORDER — SODIUM CHLORIDE 0.9 % IV BOLUS (SEPSIS)
1000.0000 mL | Freq: Once | INTRAVENOUS | Status: AC
  Administered 2016-12-07: 1000 mL via INTRAVENOUS

## 2016-12-07 MED ORDER — VANCOMYCIN HCL IN DEXTROSE 750-5 MG/150ML-% IV SOLN
750.0000 mg | INTRAVENOUS | Status: DC
  Administered 2016-12-09: 750 mg via INTRAVENOUS
  Filled 2016-12-07 (×4): qty 150

## 2016-12-07 NOTE — ED Notes (Signed)
Report given to Kristi RN on 300 

## 2016-12-07 NOTE — H&P (Addendum)
History and Physical    Ilario Dhaliwal GMW:[id number] DOB: 10-01-1934 DOA: 12/07/2016  PCP: Joaquin Courts, DO Consultants:  [facility] - cardiology; [facility] - urology; [facility] - podiatry Patient coming from:  Home - lives with wife; NOK: wife, [phone]  Chief Complaint: weakness  HPI: Jesse Lucas is a 82 y.o. male with medical history significant of PAD, HLD, and CAD presenting with weakness.  For the last week or two has been very weak, no appetite, freezing cold.  He had a fever for a few days , subjective.  At one point it was 100-101.  +nausea from the antibiotic he was taking, cefuroxime for ? - [facility]N thought that he needed it because his "urine was like pudding, it was thick.  Urine was coming back that he did not".  They called the PCP today and he suggested bringing him in to the ER.  They thought his Hgb was low and they wanted that checked.  He has an ulcer on his bottom, started as a 1 cm lesion and now 10 cm.  Draining with infection.  He is very sedentary.  It had some black in it Monday, concern for blood in the wound.  HHN is working with it but it is in a very hard place.  His wife thought it looked better today "but it is draining like crazy".  Also with an ulcer on his foot, followed by podiatry and not bad.    ED Course: Decubitus ulcer which is draining purulent material.  Elevated WBC count, increased lactate.  No signs of severe sepsis.  Hgb decreased compared to previous levels.  Heme negative.  +blood in his diaper, likely related to the ulcer.  IVF and IV abx.  Review of Systems: As per HPI; otherwise review of systems reviewed and negative.   Ambulatory Status:  Ambulates with a walker but limited exertion  Past Medical History:  Diagnosis Date  . CAD (coronary artery disease)   . Hyperlipidemia   . PAD (peripheral artery disease) (HCC)     Past Surgical History:  Procedure Laterality Date  . ABDOMINAL AORTOGRAM W/LOWER EXTREMITY N/A 06/03/2016   Procedure:  Abdominal Aortogram w/Lower Extremity;  Surgeon: Sherren Kerns, MD;  Location: [facility] INVASIVE CV LAB;  Service: Cardiovascular;  Laterality: N/A;  . LEFT HEART CATH AND CORONARY ANGIOGRAPHY N/A 07/01/2016   Procedure: Left Heart Cath and Coronary Angiography;  Surgeon: Marykay Lex, MD;  Location: [facility] INVASIVE CV LAB;  Service: Cardiovascular;  Laterality: N/A;    Social History   Social History  . Marital status: Married    Spouse name: N/A  . Number of children: N/A  . Years of education: N/A   Occupational History  . retired    Social History Main Topics  . Smoking status: Current Every Day Smoker    Years: 72.00    Types: Pipe  . Smokeless tobacco: Former Neurosurgeon     Comment: 3-4 pipes per day  . Alcohol use No  . Drug use: No  . Sexual activity: Not on file   Other Topics Concern  . Not on file   Social History Narrative  . No narrative on file    No Known Allergies  Family History  Problem Relation Age of Onset  . Diabetes Mother   . Cancer Mother        lymph node  . Heart disease Father   . Aneurysm Father   . Heart attack Brother     Prior to  Admission medications   Medication Sig Start Date End Date Taking? Authorizing Provider  acetaminophen (TYLENOL) 500 MG tablet Take 500 mg by mouth daily as needed for moderate pain or headache.   Yes [provider]  aspirin 81 MG chewable tablet Chew 81 mg by mouth daily.    Yes [provider]  carvedilol (COREG) 3.125 MG tablet TAKE 1 TABLET BY MOUTH TWICE DAILY 10/14/16  Yes Branch, Dorothe Pea, MD  cefUROXime (CEFTIN) 250 MG tablet Take 1 tablet by mouth 2 (two) times daily. 12/01/16  Yes [provider]  polyethylene glycol (MIRALAX / GLYCOLAX) packet Take 17 g by mouth daily as needed for mild constipation.   Yes [provider]  rosuvastatin (CRESTOR) 40 MG tablet Crestor 40 mg tablet  Take 1 tablet every day by oral route.   Yes [provider]  silver sulfADIAZINE  (SSD) 1 % cream SSD 1 % topical cream   Yes [provider]    Physical Exam: Vitals:   12/07/16 1900 12/07/16 1918 12/07/16 1930 12/07/16 2000  BP: (!) 111/58 (!) 111/58 (!) 106/54 (!) 111/53  Pulse: 61 73 61 64  Resp: 18 (!) 23 18 (!) 32  Temp:      TempSrc:      SpO2: 95% 95% 96% 96%  Weight:      Height:         General:  Appears calm and comfortable and is NAD; he is cachectic Eyes:  PERRL, EOMI, normal lids, iris ENT:  grossly normal hearing, lips & tongue, mmm; poor dentition Neck:  no LAD, masses or thyromegaly; no carotid bruits Cardiovascular:  RRR, no m/r/g. No LE edema.  Respiratory:   CTA bilaterally with no wheezes/rales/rhonchi.  Normal respiratory effort. Abdomen:  soft, NT, ND, NABS Back:   normal alignment, no CVAT Skin: small healing wound on his right medial heel, unconcerning.  This is a large 8 cm ulceration on his medial left buttocks with feculent, foul-smelling purulent drainage. Musculoskeletal:  grossly normal tone BUE/BLE, good ROM, no bony abnormality.  Atrophic muscles. Lower extremity: No LE edema.  Limited foot exam with only the one small healing ulceration.  2+ distal pulses. Psychiatric:  grossly normal mood and affect, speech fluent and appropriate, AOx3 Neurologic:  CN 2-12 grossly intact, moves all extremities in coordinated fashion, sensation intact    Radiological Exams on Admission: Dg Chest 2 View  Result Date: 12/07/2016 CLINICAL DATA:  Weakness. EXAM: CHEST  2 VIEW COMPARISON:  None. FINDINGS: Trachea is midline. Heart size within normal limits. Thoracic aorta is calcified. Minimal scarring at the left lung base, adjacent to an elevated left hemidiaphragm. Lungs are otherwise clear. No pleural fluid. There is flowing osteophytosis throughout the visualized spine. IMPRESSION: 1. No acute findings in the chest. 2. Flowing osteophytosis throughout the spine is indicative of ankylosing spondylitis. Electronically Signed   By:  Leanna Battles M.D.   On: 12/07/2016 16:58    EKG: Independently reviewed.  NSR with rate 68; marked artifact   Labs on Admission: I have personally reviewed the available labs and imaging studies at the time of the admission.  Pertinent labs:   Lactate 2.40 Heme negative Glucose 277 BUN 41/Creatinine 1.34/GFR 56; baseline 0.9 in 4/18 Albumin 2.0 WBC 11.4 Hgb 9.6; prior 11.1 in 5/18   Assessment/Plan Principal Problem:   Sepsis (HCC) Active Problems:   Unstageable pressure ulcer of sacral region [facility])   CAD (coronary artery disease)   PAD (peripheral artery disease) (HCC)  Hyperlipidemia   Severe protein-calorie malnutrition (HCC)   AKI (acute kidney injury) (HCC)   Tobacco dependence   Sepsis, probable source is a large pressure ulcer on his left buttocks  -Elevated WBC count, tachypnea with elevated lactate to 2.4 and borderline hypotension -While awaiting blood cultures, this appears to be a preseptic condition. -Sepsis protocol initiated -Source appears to be the pressure ulcer located on his left buttocks.  It is about 8 cm, quite deep, and with purulent, very foul-smelling draining and surrounding erythema. -Blood and urine cultures pending -Will admit to Med Surg and continue to monitor -Treat with IV Vanc/Zosyn for now -Will trend lactate to ensure improvement -Will order procalcitonin level.  Antibiotics would not be indicated for PCT <0.1 and probably should not be used for < 0.25.  >0.5 indicates infection and >>0.5 indicates more serious disease.  As the procalcitonin level normalizes, it will be reasonable to consider de-escalation of antibiotic coverage. -Will request wound care by the nursing staff as well as formal wound care consult. -It is possible that he will need I&D and/or placement of a wound vac, but will defer to wound care nurse to determine if this is indicated.   -He was recently started on Ceftin - ?for UTI.  Will hold that medication at this  time.  AKI -Likely the combination of poor PO intake and increased metabolic demands due to his infection  -Gently rehydrate and follow  Malnutrition -Nutrition consult -Based on his albumin alone, he likely is Hospice-qualifying.  CAD/PVD/HLD -Continue ASA, Coreg, Crestor -He has been offered a popliteal to post tibial bypass, but that was for a poorly healing foot ulcer which is much improved -Multivessel CAD, med management  Tobacco dependence -Encourage cessation.  This was discussed with the patient and should be reviewed on an ongoing basis.  -Patch declined by patient.  DVT prophylaxis: Lovenox Code Status:  Full - confirmed with patient/family Family Communication: Wife and daughter present throughout evaluation Disposition Plan:  Home once clinically improved Consults called: Wound care  Admission status: Admit - It is my clinical opinion that admission to INPATIENT is reasonable and necessary because this patient will require at least 2 midnights in the hospital to treat this condition based on the medical complexity of the problems presented.  Given the aforementioned information, the predictability of an adverse outcome is felt to be significant.    Jonah Blue MD [facility]  If note is complete, please contact covering daytime or nighttime physician. [email or web address] Password [email or web address]  12/07/2016, 9:16 PM

## 2016-12-07 NOTE — Progress Notes (Signed)
Pharmacy Antibiotic Note  Jesse Lucas is a 82 y.o. male admitted on 12/07/2016 with cellulitis.  Pharmacy has been consulted for vancomycin and zosyn dosing. Initial doses ordered in the ED  Plan: Continue vancomycin 750 mg IV q24 hours Continue zosyn 3.375 gm IV q8 hours F/u renal function, cultures and clinical course  Height:  (172.7 cm) Weight: 138 lb (62.6 kg) IBW/kg (Calculated) : 68.4  Temp (24hrs), Avg:98.4 F (36.9 C), Min:98.4 F (36.9 C), Max:98.4 F (36.9 C)   Recent Labs Lab 12/07/16 1441  LATICACIDVEN 2.40*    CrCl cannot be calculated (Patient's most recent lab result is older than the maximum 21 days allowed.).    No Known Allergies   Thank you for allowing pharmacy to be a part of this patient's care.  Talbert Cage [place]et 12/07/2016 3:12 PM

## 2016-12-07 NOTE — ED Triage Notes (Signed)
Pt reports generalized weakness, nausea, and decreased appetite for past week.  Reports had a fever over the weekend and was put on antibiotics.  Pt says doesn't feel any better.

## 2016-12-07 NOTE — ED Provider Notes (Signed)
[facility]-EMER[facility]ENCY DEPT Provider Note   CSN: [id number] Arrival date & time: 12/07/16  1359     History   Chief Complaint Chief Complaint  Patient presents with  . Weakness    HPI Jesse Lucas is a 82 y.o. male.  HPI Patient presents to the emergency room for evaluation of generalized weakness, nausea, decreased appetite and subjective fevers and abnormal laboratory tests. Patient states he is felt poorly over the last week. He does have a wound on his buttock. It has been draining purulent material. According to the nursing notes he was started on antibiotics. Patient states home health went to check on him today. He had outpatient laboratory tests and was told that he was anemic and needed to come to the emergency room. Patient denies any cough. He denies any vomiting or diarrhea. No dysuria. He denies any abdominal pain that has had some nausea and decreased appetite. He is feeling generally weak and chilled. Past Medical History:  Diagnosis Date  . Hyperlipidemia   . PAD (peripheral artery disease) [facility])     Patient Active Problem List   Diagnosis Date Noted  . Pre-operative cardiovascular examination 07/01/2016  .  HIGH RISK MYOVIEW 06/24/2016    Past Surgical History:  Procedure Laterality Date  . ABDOMINAL AORTOGRAM W/LOWER EXTREMITY N/A 06/03/2016   Procedure: Abdominal Aortogram w/Lower Extremity;  Surgeon: Sherren Kerns, MD;  Location: [facility] INVASIVE CV LAB;  Service: Cardiovascular;  Laterality: N/A;  . LEFT HEART CATH AND CORONARY ANGIOGRAPHY N/A 07/01/2016   Procedure: Left Heart Cath and Coronary Angiography;  Surgeon: Marykay Lex, MD;  Location: [facility] INVASIVE CV LAB;  Service: Cardiovascular;  Laterality: N/A;       Home Medications    Prior to Admission medications   Medication Sig Start Date End Date Taking? Authorizing Provider  acetaminophen (TYLENOL) 500 MG tablet Take 500 mg by mouth daily as needed for moderate pain or headache.   Yes [provider]  aspirin 81 MG chewable tablet Chew 81 mg by mouth daily.    Yes [provider]  carvedilol (COREG) 3.125 MG tablet TAKE 1 TABLET BY MOUTH TWICE DAILY 10/14/16  Yes Branch, Dorothe Pea, MD  cefUROXime (CEFTIN) 250 MG tablet Take 1 tablet by mouth 2 (two) times daily. 12/01/16  Yes [provider]  polyethylene glycol (MIRALAX / GLYCOLAX) packet Take 17 g by mouth daily as needed for mild constipation.   Yes [provider]  rosuvastatin (CRESTOR) 40 MG tablet Crestor 40 mg tablet  Take 1 tablet every day by oral route.   Yes [provider]  silver sulfADIAZINE (SSD) 1 % cream SSD 1 % topical cream   Yes [provider]    Family History Family History  Problem Relation Age of Onset  . Diabetes Mother   . Cancer Mother        lymph node  . Heart disease Father   . Aneurysm Father   . Heart attack Brother     Social History Social History  Substance Use Topics  . Smoking status: Current Every Day Smoker    Types: Pipe  . Smokeless tobacco: Former Neurosurgeon     Comment: 3-4 pipes per day  . Alcohol use No     Allergies   Patient has no known allergies.   Review of Systems Review of Systems  Gastrointestinal:       Frequent belching, has noticed blood in his undergarment but not sure if it has been  in the stool  All other systems reviewed and are negative.    Physical Exam Updated Vital Signs BP (!) 112/50   Pulse 61   Temp 98.4 F (36.9 C) (Oral)   Resp (!) 27   Ht 1.727 m ( )   Wt 62.6 kg (138 lb)   SpO2 98%   BMI 20.98 kg/m   Physical Exam  Constitutional: No distress.  HENT:  Head: Normocephalic and atraumatic.  Right Ear: External ear normal.  Left Ear: External ear normal.  Eyes: Conjunctivae are normal. Right eye exhibits no discharge. Left eye exhibits no discharge. No scleral icterus.  Neck: Neck supple. No tracheal deviation present.  Cardiovascular: Normal rate, regular rhythm and intact  distal pulses.   Pulmonary/Chest: Effort normal and breath sounds normal. No stridor. No respiratory distress. He has no wheezes. He has no rales.  Abdominal: Soft. Bowel sounds are normal. He exhibits no distension. There is no tenderness. There is no rebound and no guarding.  Genitourinary:  Genitourinary Comments: Wound on the buttock, draining bloody purulent material  Musculoskeletal: He exhibits no edema or tenderness.  No erythema or swelling  Neurological: He is alert. He has normal strength. No cranial nerve deficit (no facial droop, extraocular movements intact, no slurred speech) or sensory deficit. He exhibits normal muscle tone. He displays no seizure activity. Coordination normal.  Skin: Skin is warm and dry. No rash noted. He is not diaphoretic.  Psychiatric: He has a normal mood and affect.  Nursing note and vitals reviewed.    ED Treatments / Results  Labs (all labs ordered are listed, but only abnormal results are displayed) Labs Reviewed  COMPREHENSIVE METABOLIC PANEL - Abnormal; Notable for the following:       Result Value   Sodium 133 (*)    Chloride 97 (*)    Glucose, Bld 177 (*)    BUN 41 (*)    Creatinine, Ser 1.34 (*)    Calcium 8.4 (*)    Albumin 2.0 (*)    Total Bilirubin 0.2 (*)    GFR calc non Af Amer 49 (*)    GFR calc Af Amer 56 (*)    All other components within normal limits  CBC WITH DIFFERENTIAL/PLATELET - Abnormal; Notable for the following:    WBC 11.4 (*)    RBC 3.26 (*)    Hemoglobin 9.6 (*)    HCT 29.5 (*)    Neutro Abs 9.6 (*)    All other components within normal limits  I-STAT CG4 LACTIC ACID, ED - Abnormal; Notable for the following:    Lactic Acid, Venous 2.40 (*)    All other components within normal limits  CULTURE, BLOOD (ROUTINE X 2)  CULTURE, BLOOD (ROUTINE X 2)  URINALYSIS, ROUTINE W REFLEX MICROSCOPIC  POC OCCULT BLOOD, ED  I-STAT CG4 LACTIC ACID, ED    EKG  EKG Interpretation  Date/Time:  Wednesday December 07 2016 14:30:37 EDT Ventricular Rate:  68 PR Interval:    QRS Duration: 118 QT Interval:  388 QTC Calculation: 413 R Axis:   8 Text Interpretation:  porr data quality ECG Most likely normal sinus rhythm Poor data quality in current ECG precludes serial comparison Confirmed by Linwood Dibbles [phone]) on 12/07/2016 2:43:33 PM       Radiology Dg Chest 2 View  Result Date: 12/07/2016 CLINICAL DATA:  Weakness. EXAM: CHEST  2 VIEW COMPARISON:  None. FINDINGS: Trachea is midline. Heart size within normal limits. Thoracic aorta is calcified. Minimal  scarring at the left lung base, adjacent to an elevated left hemidiaphragm. Lungs are otherwise clear. No pleural fluid. There is flowing osteophytosis throughout the visualized spine. IMPRESSION: 1. No acute findings in the chest. 2. Flowing osteophytosis throughout the spine is indicative of ankylosing spondylitis. Electronically Signed   By: Leanna Battles M.D.   On: 12/07/2016 16:58    Procedures Procedures (including critical care time)  Medications Ordered in ED Medications  vancomycin (VANCOCIN) IVPB 1000 mg/200 mL premix (1,000 mg Intravenous New Bag/Given 12/07/16 1650)  sodium chloride 0.9 % bolus 1,000 mL (1,000 mLs Intravenous New Bag/Given 12/07/16 1546)    And  sodium chloride 0.9 % bolus 1,000 mL (1,000 mLs Intravenous New Bag/Given 12/07/16 1546)  piperacillin-tazobactam (ZOSYN) IVPB 3.375 g (0 g Intravenous Stopped 12/07/16 1652)     Initial Impression / Assessment and Plan / ED Course  I have reviewed the triage vital signs and the nursing notes.  Pertinent labs & imaging results that were available during my care of the patient were reviewed by me and considered in my medical decision making (see chart for details).  Clinical Course as of Dec 07 1732  Wed Dec 07, 2016  1649 Hgb is decreased compared to 5 months ago.  Will check stool.  Difficult to say if he is having rectal bleeding or just blood from the wound  [JK]      Clinical Course User Index [JK] Linwood Dibbles, MD    Patient presented to the emergency room for nausea weakness and fever. Patient has a decubitus ulcer that seems to be draining purulent material.  Laboratory tests are notable for an elevated white blood cell count and lactic acid level. No signs of severe sepsis.  Patient's hemoglobin is also decreased compared to previous levels. Stool is negative for blood. He does have blood in his diaper but I think this is related to the decubitus ulcer.  Patient has been started on IV antibiotics. IV fluids have been ordered. I will consult the medical service for admission. Care of his decubitus ulcer.  Final Clinical Impressions(s) / ED Diagnoses   Final diagnoses:  Pressure injury of left buttock, stage 3 (HCC)  Cellulitis of buttock  Acute blood loss anemia      Linwood Dibbles, MD 12/07/16 1734

## 2016-12-08 DIAGNOSIS — E785 Hyperlipidemia, unspecified: Secondary | ICD-10-CM

## 2016-12-08 DIAGNOSIS — L89324 Pressure ulcer of left buttock, stage 4: Secondary | ICD-10-CM | POA: Diagnosis present

## 2016-12-08 DIAGNOSIS — L89323 Pressure ulcer of left buttock, stage 3: Secondary | ICD-10-CM

## 2016-12-08 DIAGNOSIS — I739 Peripheral vascular disease, unspecified: Secondary | ICD-10-CM

## 2016-12-08 DIAGNOSIS — I2510 Atherosclerotic heart disease of native coronary artery without angina pectoris: Secondary | ICD-10-CM

## 2016-12-08 LAB — GLUCOSE, CAPILLARY
GLUCOSE-CAPILLARY: 176 mg/dL — AB (ref 65–99)
GLUCOSE-CAPILLARY: 224 mg/dL — AB (ref 65–99)
Glucose-Capillary: 155 mg/dL — ABNORMAL HIGH (ref 65–99)
Glucose-Capillary: 326 mg/dL — ABNORMAL HIGH (ref 65–99)

## 2016-12-08 LAB — BASIC METABOLIC PANEL
ANION GAP: 10 (ref 5–15)
BUN: 38 mg/dL — AB (ref 6–20)
CO2: 23 mmol/L (ref 22–32)
CREATININE: 1.3 mg/dL — AB (ref 0.61–1.24)
Calcium: 7.8 mg/dL — ABNORMAL LOW (ref 8.9–10.3)
Chloride: 99 mmol/L — ABNORMAL LOW (ref 101–111)
GFR calc Af Amer: 59 mL/min — ABNORMAL LOW (ref >60)
GFR calc non Af Amer: 51 mL/min — ABNORMAL LOW (ref >60)
GLUCOSE: 195 mg/dL — AB (ref 65–99)
POTASSIUM: 3.3 mmol/L — AB (ref 3.5–5.1)
SODIUM: 132 mmol/L — AB (ref 135–145)

## 2016-12-08 LAB — HEMOGLOBIN A1C
Hgb A1c MFr Bld: 7.2 % — ABNORMAL HIGH (ref 4.8–5.6)
Mean Plasma Glucose: 159.94 mg/dL

## 2016-12-08 LAB — LACTIC ACID, PLASMA: Lactic Acid, Venous: 1.2 mmol/L (ref 0.5–1.9)

## 2016-12-08 LAB — CBC
HEMATOCRIT: 25.5 % — AB (ref 39.0–52.0)
Hemoglobin: 8.3 g/dL — ABNORMAL LOW (ref 13.0–17.0)
MCH: 29.2 pg (ref 26.0–34.0)
MCHC: 32.5 g/dL (ref 30.0–36.0)
MCV: 89.8 fL (ref 78.0–100.0)
PLATELETS: 305 10*3/uL (ref 150–400)
RBC: 2.84 MIL/uL — ABNORMAL LOW (ref 4.22–5.81)
RDW: 13.4 % (ref 11.5–15.5)
WBC: 11.2 10*3/uL — AB (ref 4.0–10.5)

## 2016-12-08 LAB — APTT: APTT: 40 s — AB (ref 24–36)

## 2016-12-08 MED ORDER — POTASSIUM CHLORIDE CRYS ER 20 MEQ PO TBCR
40.0000 meq | EXTENDED_RELEASE_TABLET | Freq: Once | ORAL | Status: DC
  Filled 2016-12-08: qty 2

## 2016-12-08 MED ORDER — ORAL CARE MOUTH RINSE
15.0000 mL | Freq: Two times a day (BID) | OROMUCOSAL | Status: DC
  Administered 2016-12-10 – 2016-12-11 (×2): 15 mL via OROMUCOSAL

## 2016-12-08 MED ORDER — DEXTROSE 50 % IV SOLN
INTRAVENOUS | Status: AC
  Filled 2016-12-08: qty 50

## 2016-12-08 MED ORDER — COLLAGENASE 250 UNIT/GM EX OINT
TOPICAL_OINTMENT | Freq: Every day | CUTANEOUS | Status: DC
  Administered 2016-12-10 – 2016-12-12 (×3): via TOPICAL
  Filled 2016-12-08: qty 30

## 2016-12-08 MED ORDER — CHLORHEXIDINE GLUCONATE 0.12 % MT SOLN
15.0000 mL | Freq: Two times a day (BID) | OROMUCOSAL | Status: DC
  Administered 2016-12-08 – 2016-12-11 (×8): 15 mL via OROMUCOSAL
  Filled 2016-12-08 (×8): qty 15

## 2016-12-08 MED ORDER — SODIUM CHLORIDE 0.9 % IV SOLN
INTRAVENOUS | Status: DC
Start: 2016-12-08 — End: 2016-12-12
  Administered 2016-12-08 – 2016-12-10 (×5): via INTRAVENOUS

## 2016-12-08 NOTE — Evaluation (Signed)
Occupational Therapy Evaluation Patient Details Name: Jesse Lucas MRN: [id number] DOB: 01/25/1935 Today's Date: 12/08/2016    History of Present Illness Jesse Lucas is a 82 y.o. male with medical history significant of PAD, HLD, and CAD presenting with weakness.  For the last week or two has been very weak, no appetite, freezing cold.  He had a fever for a few days , subjective.  At one point it was 100-101.  +nausea from the antibiotic he was taking, cefuroxime for ? - HHN thought that he needed it because his "urine was like pudding, it was thick.  Urine was coming back that he did not".  They called the PCP today and he suggested bringing him in to the ER.  They thought his Hgb was low and they wanted that checked.  He has an ulcer on his bottom, started as a 1 cm lesion and now 10 cm.  Draining with infection.  He is very sedentary.  It had some black in it Monday, concern for blood in the wound.  HHN is working with it but it is in a very hard place.  His wife thought it looked better today "but it is draining like crazy".  Also with an ulcer on his foot, followed by podiatry and not bad.    Clinical Impression   Pt received supine in bed, agreeable to OT evaluation this am. PTA pt wife assisting with ADL completion, from pt report mostly set-up and assist with getting in/out of bathtub. During evaluation pt limited in ADL completion and mobility due to weakness and fatigue. Mod A required for transfers, upon sitting pt able to complete UB ADLs with set-up. At this time recommend SNF on discharge to improve safety and independence in B/ADL completion.     Follow Up Recommendations  SNF    Equipment Recommendations  None recommended by OT       Precautions / Restrictions Precautions Precautions: Fall Restrictions Weight Bearing Restrictions: No      Mobility Bed Mobility Overal bed mobility: Needs Assistance Bed Mobility: Supine to Sit     Supine to sit: Min guard      General bed mobility comments: Pt requires increased time, verbal cuing for sequencing  Transfers Overall transfer level: Needs assistance Equipment used: Rolling walker (2 wheeled) Transfers: Sit to/from [employer] Sit to Stand: Mod assist;From elevated surface Stand pivot transfers: Min assist       General transfer comment: Pt unsteady upon standing, verbal cuing for using BUE and walker for balance.        ADL either performed or assessed with clinical judgement   ADL Overall ADL's : Needs assistance/impaired Eating/Feeding: Modified independent;Sitting Eating/Feeding Details (indicate cue type and reason): Pt with no difficulty opening packages and preparing meal Grooming: Set up;Sitting Grooming Details (indicate cue type and reason): Pt unable to perform in standing due to weakness and balance deficits Upper Body Bathing: Minimal assistance;Sitting Upper Body Bathing Details (indicate cue type and reason): Per pt report wife assists with bathing hard to reach areas-back Lower Body Bathing: Maximal assistance;Sitting/lateral leans Lower Body Bathing Details (indicate cue type and reason): Wife assists with bathing BLE due to pt unsteadiness with bending forward Upper Body Dressing : Set up;Sitting   Lower Body Dressing: Maximal assistance;Sitting/lateral leans Lower Body Dressing Details (indicate cue type and reason): Pt unable to reach feet to doff/donn socks  Vision Baseline Vision/History: No visual deficits Patient Visual Report: No change from baseline Vision Assessment?: No apparent visual deficits            Pertinent Vitals/Pain Pain Assessment: No/denies pain     Hand Dominance Right   Extremity/Trunk Assessment Upper Extremity Assessment Upper Extremity Assessment: Generalized weakness (grossly 3/5)   Lower Extremity Assessment Lower Extremity Assessment: Defer to PT evaluation   Cervical / Trunk  Assessment Cervical / Trunk Assessment: Kyphotic   Communication Communication Communication: No difficulties   Cognition Arousal/Alertness: Awake/alert Behavior During Therapy: WFL for tasks assessed/performed Overall Cognitive Status: Within Functional Limits for tasks assessed                                                Home Living Family/patient expects to be discharged to:: Private residence Living Arrangements: Spouse/significant other Available Help at Discharge: Family;Available 24 hours/day Type of Home: House Home Access: Stairs to enter [employer] of Steps: 1 step at front, 4 at side of house Entrance Stairs-Rails: Right;Left;Can reach both Home Layout: Two level Alternate Level Stairs-Number of Steps: 8 (pt does not go to basement) Alternate Level Stairs-Rails: Left Bathroom Shower/Tub: Tub/shower unit   Bathroom Toilet: Handicapped height     Home Equipment: Toilet riser;Walker - 2 wheels;Cane - single point;Shower seat;Grab bars - tub/shower;Wheelchair - manual (waiting on Scientist, product/process development)          Prior Functioning/Environment Level of Independence: Needs assistance  Gait / Transfers Assistance Needed: Pt uses RW for functional mobility ADL's / Homemaking Assistance Needed: Wife assists with dressing, bathing, grooming, and meal preparation. HHRN comes 2x/week            OT Problem List: Decreased strength;Decreased activity tolerance;Impaired balance (sitting and/or standing);Decreased safety awareness      OT Treatment/Interventions: Self-care/ADL training;Therapeutic exercise;DME and/or AE instruction;Therapeutic activities;Patient/family education    OT Goals(Current goals can be found in the care plan section) Acute Rehab OT Goals Patient Stated Goal: to get better OT Goal Formulation: With patient Time For Goal Achievement: 12/22/16 Potential to Achieve Goals: Good  OT Frequency: Min 2X/week    End of  Session Equipment Utilized During Treatment: Gait belt;Rolling walker  Activity Tolerance: Patient limited by fatigue Patient left: in chair;with call bell/phone within reach;with nursing/sitter in room  OT Visit Diagnosis: Muscle weakness (generalized) (M62.81)                Time: 6962-9528 OT Time Calculation (min): 46 min Charges:  OT General Charges $OT Visit: 1 Visit OT Evaluation $OT Eval Low Complexity: 1 Low   Ezra Sites, OTR/L  [phone] 12/08/2016, 8:42 AM

## 2016-12-08 NOTE — Progress Notes (Signed)
Pt transferred to step-down.  Report given to [facility], Charity fundraiser.

## 2016-12-08 NOTE — Progress Notes (Signed)
Initial Nutrition Assessment  DOCUMENTATION CODES:      INTERVENTION:  Ensure Enlive po BID, each supplement provides 350 kcal and 20 grams of protein   Obtained breakfast- food preferences   RD will continue to follow    NUTRITION DIAGNOSIS:   Increased nutrient needs related to wound healing as evidenced by estimated needs.  GOAL:   Patient will meet greater than or equal to 90% of their needs  MONITOR:   Skin, PO intake, Supplement acceptance, Labs  REASON FOR ASSESSMENT:   Consult  (Malnutrition)  ASSESSMENT: Mr Enriquez is a 82 yo male with hx of PAD, CAD and decubitus ulcer. He presents with complaint of weakness. WOC assessment unstageable PI to left ischium and right heel. Surgery has also been consulted for possible debridement.  Diet hx: the patient is complaining about his breakfast and says he did not eat much this morning or at lunch. A family member has gone out for a meal which he has eaten 25-50%.   Weight hx: wt in May 68 kg but is now down (4.5 kg) 10 lbs or 7% in the past 5 months.  Nutrition focused exam- deferred due to abreviated access to pt. RD will follow up for additional nutrition assessment details. Expect this patient is malnourished based on his unplanned wt loss, Sepsis, Unstageable wound.  Recent Labs Lab 12/07/16 1425 12/08/16 0544  NA 133* 132*  K 3.5 3.3*  CL 97* 99*  CO2 27 23  BUN 41* 38*  CREATININE 1.34* 1.30*  CALCIUM 8.4* 7.8*  GLUCOSE 177* 195*  Labs: sodium 132, potassium 3.3, BUN 38, Cr 1.3 and glucose  Meds: insulin, colace, potassium  Diet Order:  Diet heart healthy/carb modified Room service appropriate? Yes; Fluid consistency: Thin  Skin:   (Unstageable to left buttock and stage 3 to right heel.)  Last BM:  10/10  Height:   Ht Readings from Last 1 Encounters:  12/07/16  (1.727 m)    Weight:   Wt Readings from Last 1 Encounters:  12/07/16 139 lb 12.4 oz (63.4 kg)    Ideal Body Weight:  70  kg  BMI:  Body mass index is 21.25 kg/m.  Estimated Nutritional Needs:   Kcal:  2079-2205 (33-35 kcal/kg)  Protein:  82-89 gr   Fluid:  1.9 liters daily  EDUCATION NEEDS:  Briefly reviewed protein sources and talked about foods he likes. Will also provide handout nutrition therapy for wound healing.   Royann Shivers MS,RD,CSG,LDN Office: [phone] Pager: [phone]

## 2016-12-08 NOTE — Consult Note (Signed)
WOC Nurse wound consult note Reason for Consult: buttock ulceration and foot ulcer Wound type: Unstageable pressure injury left ischium Resolving foot ulceration right medial heel, unclear etiology Pressure Injury POA: Yes Wound bed: Right medial heel; small black circular area Left ischium: limited visualization, 100% yellow, fluctuant, foul odor, and actively oozing thick bloody drainage.   Drainage (amount, consistency, odor) see above Periwound: erythema with mild induration  Dressing procedure/placement/frequency: Will begin enzymatic debridement, however I have spoken with the hospitalist and recommended a surgical evaluation.  With the patient's elevated WBC, lactic acid,  generalized weakness and probable sepsis most likely needs surgical debridement if possible.    Discussed POC with patient and bedside nurse.  Re consult if needed, will not follow at this time. Thanks  Elzena Muston [employer], RN,CWOCN, CNS, CWON-AP [phone])

## 2016-12-08 NOTE — Progress Notes (Addendum)
[facility]  PROGRESS NOTE  Jesse Lucas ZOX:[id number] DOB: 02-17-1935 DOA: 12/07/2016 PCP: Joaquin Courts, DO   Brief HPI:    82 y.o. male with medical history significant of PAD, HLD, and CAD presenting with weakness.  For the last week or two has been very weak, no appetite, freezing cold.  He had a fever for a few days , subjective.  At one point it was 100-101.  +nausea from the antibiotic he was taking, cefuroxime for ? - HHN thought that he needed it because his "urine was like pudding, it was thick.  Urine was coming back that he did not".  They called the PCP today and he suggested bringing him in to the ER.  They thought his Hgb was low and they wanted that checked.  He has an ulcer on his bottom, started as a 1 cm lesion and now 10 cm.  Draining with infection.  He is very sedentary.  It had some black in it Monday, concern for blood in the wound.  HHN is working with it but it is in a very hard place.  His wife thought it looked better today "but it is draining like crazy".  Also with an ulcer on his foot, followed by podiatry and not bad.    Subjective   Patient seen and examined, denies any complaints.   Assessment/Plan:     1. Sepsis- due to large pressure ulcer on left buttock, patient came with leukocytosis, lactic 2.4 and hypotension. Started on sepsis protocol. Vancomycin and Zosyn started. W BC is 11.2. Lactic acid is down to 1.2. Will continue with antibiotics. 2. Buttock ulceration and foot ulcer- patient was seen by wound care nurse, she recommend surgical evaluation for possible debridement of the ulcer. We will consult general surgery for further evaluation. 3. Acute kidney injury- likely from poor by mouth intake, started on gentle IV hydration. Patient's creatinine today is 1.30. Will follow BMP in a.m. 4. Chronic anemia-patient's hemoglobin down to 8.3, came with hemoglobin of 9.5. Likely dilutional as patient received IV fluids for possible sepsis.  Previous hemoglobin from April/May was 11.1. Follow CBC in a.m. 5. CAD/PVD/hyperlipidemia-continue aspirin, Coreg, Crestor     DVT prophylaxis: Lovenox  Code Status: Full code  Family Communication: No family present at bedside   Disposition Plan: Pending improvement and clinical condition   Consultants:  None  Procedures:  None  Continuous infusions . lactated ringers 50 mL/hr at 12/07/16 2204  . piperacillin-tazobactam (ZOSYN)  IV 3.375 g (12/08/16 0823)  . vancomycin        Antibiotics:   Anti-infectives    Start     Dose/Rate Route Frequency Ordered Stop   12/08/16 1600  vancomycin (VANCOCIN) IVPB 750 mg/150 ml premix     750 mg 150 mL/hr over 60 Minutes Intravenous Every 24 hours 12/07/16 2108     12/08/16 0000  piperacillin-tazobactam (ZOSYN) IVPB 3.375 g     3.375 g 12.5 mL/hr over 240 Minutes Intravenous Every 8 hours 12/07/16 2108     12/07/16 1515  piperacillin-tazobactam (ZOSYN) IVPB 3.375 g     3.375 g 100 mL/hr over 30 Minutes Intravenous  Once 12/07/16 1507 12/07/16 1652   12/07/16 1515  vancomycin (VANCOCIN) IVPB 1000 mg/200 mL premix     1,000 mg 200 mL/hr over 60 Minutes Intravenous  Once 12/07/16 1507 12/07/16 1759       Objective   Vitals:   12/07/16 2000 12/07/16 2100 12/07/16 2121 12/08/16 0541  BP: Marland Kitchen)  111/53 (!) 120/51  (!) 95/58  Pulse: 64   71  Resp: (!) 32 (!) 22  20  Temp:  98.9 F (37.2 C)  98.6 F (37 C)  TempSrc:  Oral  Oral  SpO2: 96% 97% 96% 97%  Weight:  63.4 kg (139 lb 12.4 oz)    Height:   (1.727 m)      Intake/Output Summary (Last 24 hours) at 12/08/16 1250 Last data filed at 12/08/16 0533  Gross per 24 hour  Intake          2674.17 ml  Output                0 ml  Net          2674.17 ml   Filed Weights   12/07/16 1402 12/07/16 2100  Weight: 62.6 kg (138 lb) 63.4 kg (139 lb 12.4 oz)     Physical Examination:   Physical Exam: Eyes: No icterus, extraocular muscles intact  Mouth: Oral mucosa is  moist, no lesions on palate,  Neck: Supple, no deformities, masses, or tenderness Lungs: Normal respiratory effort, bilateral clear to auscultation, no crackles or wheezes.  Heart: Regular rate and rhythm, S1 and S2 normal, no murmurs, rubs auscultated Abdomen: BS normoactive,soft,nondistended,non-tender to palpation,no organomegaly Extremities: No pretibial edema, no erythema, no cyanosis, no clubbing Neuro : Alert and oriented to time, place and person, No focal deficits  Skin: Buttock and leg wounds in  dressing     Data Reviewed: I have personally reviewed following labs and imaging studies  CBG:  Recent Labs Lab 12/07/16 2202 12/08/16 0800 12/08/16 1241  GLUCAP 182* 155* 176*    CBC:  Recent Labs Lab 12/07/16 1425 12/08/16 0544  WBC 11.4* 11.2*  NEUTROABS 9.6*  --   HGB 9.6* 8.3*  HCT 29.5* 25.5*  MCV 90.5 89.8  PLT 358 305    Basic Metabolic Panel:  Recent Labs Lab 12/07/16 1425 12/08/16 0544  NA 133* 132*  K 3.5 3.3*  CL 97* 99*  CO2 27 23  GLUCOSE 177* 195*  BUN 41* 38*  CREATININE 1.34* 1.30*  CALCIUM 8.4* 7.8*    Recent Results (from the past 240 hour(s))  Blood Culture (routine x 2)     Status: None (Preliminary result)   Collection Time: 12/07/16  3:22 PM  Result Value Ref Range Status   Specimen Description LEFT ANTECUBITAL  Final   Special Requests   Final    BOTTLES DRAWN AEROBIC AND ANAEROBIC Blood Culture adequate volume   Culture NO GROWTH < 24 HOURS  Final   Report Status PENDING  Incomplete  Blood Culture (routine x 2)     Status: None (Preliminary result)   Collection Time: 12/07/16  3:28 PM  Result Value Ref Range Status   Specimen Description RIGHT ANTECUBITAL  Final   Special Requests   Final    BOTTLES DRAWN AEROBIC AND ANAEROBIC Blood Culture adequate volume   Culture NO GROWTH < 24 HOURS  Final   Report Status PENDING  Incomplete     Liver Function Tests:  Recent Labs Lab 12/07/16 1425  AST 40  ALT 45  ALKPHOS  125  BILITOT 0.2*  PROT 6.7  ALBUMIN 2.0*     Studies: Dg Chest 2 View  Result Date: 12/07/2016 CLINICAL DATA:  Weakness. EXAM: CHEST  2 VIEW COMPARISON:  None. FINDINGS: Trachea is midline. Heart size within normal limits. Thoracic aorta is calcified. Minimal scarring at the left lung base, adjacent  to an elevated left hemidiaphragm. Lungs are otherwise clear. No pleural fluid. There is flowing osteophytosis throughout the visualized spine. IMPRESSION: 1. No acute findings in the chest. 2. Flowing osteophytosis throughout the spine is indicative of ankylosing spondylitis. Electronically Signed   By: Leanna Battles M.D.   On: 12/07/2016 16:58    Scheduled Meds: . aspirin  81 mg Oral Daily  . carvedilol  3.125 mg Oral BID WC  . chlorhexidine  15 mL Mouth Rinse BID  . collagenase   Topical Daily  . docusate sodium  100 mg Oral BID  . enoxaparin (LOVENOX) injection  40 mg Subcutaneous Q24H  . feeding supplement (ENSURE ENLIVE)  237 mL Oral BID BM  . insulin aspart  0-5 Units Subcutaneous QHS  . insulin aspart  0-9 Units Subcutaneous TID WC  . mouth rinse  15 mL Mouth Rinse q12n4p  . rosuvastatin  40 mg Oral q1800  . silver sulfADIAZINE   Topical BID      Time spent: 25 min  [facility]   [facility] Pager [phone]. If 7PM-7AM, please contact night-coverage at [email or web address], Office  [phone]  password [email or web address]  12/08/2016, 12:50 PM  LOS: 1 day

## 2016-12-08 NOTE — Consult Note (Signed)
[facility] Consult  Reason for Consult: Left ischial ulcer and sepsis  Referring Physician:  Dr. Darrick Meigs   Chief Complaint    Weakness      Jesse Lucas is a 82 y.o. male.   HPI: Jesse Lucas is a 82 yo with multiple medical issues who presented with malaise and weakness over the last few days. He has been having some fevers as high as 101F, and his family reports that he goes from lying in the bed to sitting in his chair at home.  He has had prior ulcers on his buttock and foot.  The areas have been healing but in the last few days the wife reports that the left buttock wound has been draining purulent drainage and has enlarged.  The wound RN was called recommended that surgery be called due to the extent of the ischial ulcer.  The patient has a WBC and was hypotension on his arrival.   Past Medical History:  Diagnosis Date  . CAD (coronary artery disease)   . Hyperlipidemia   . PAD (peripheral artery disease) ([place])     Past Surgical History:  Procedure Laterality Date  . ABDOMINAL AORTOGRAM W/LOWER EXTREMITY N/A 06/03/2016   Procedure: Abdominal Aortogram w/Lower Extremity;  Surgeon: Elam Dutch, MD;  Location: [place] [facility];  Service: Cardiovascular;  Laterality: N/A;  . LEFT HEART CATH AND CORONARY ANGIOGRAPHY N/A 07/01/2016   Procedure: Left Heart Cath and Coronary Angiography;  Surgeon: Leonie Man, MD;  Location: [place] [facility];  Service: Cardiovascular;  Laterality: N/A;    Family History  Problem Relation Age of Onset  . Diabetes Mother   . Cancer Mother        lymph node  . Heart disease Father   . Aneurysm Father   . Heart attack Brother     Social History  Substance Use Topics  . Smoking status: Current Every Day Smoker    Years: 72.00    Types: Pipe  . Smokeless tobacco: Former Systems developer     Comment: 3-4 pipes per day  . Alcohol use No    Medications:  I have reviewed the patient's current medications. Prior to Admission:   Prescriptions Prior to Admission  Medication Sig Dispense Refill Last Dose  . acetaminophen (TYLENOL) 500 MG tablet Take 500 mg by mouth daily as needed for moderate pain or headache.   Past Week at Unknown time  . aspirin 81 MG chewable tablet Chew 81 mg by mouth daily.    12/07/2016 at Unknown time  . carvedilol (COREG) 3.125 MG tablet TAKE 1 TABLET BY MOUTH TWICE DAILY 180 tablet 1 12/07/2016 at 0930  . cefUROXime (CEFTIN) 250 MG tablet Take 1 tablet by mouth 2 (two) times daily.   12/07/2016 at Unknown time  . polyethylene glycol (MIRALAX / GLYCOLAX) packet Take 17 g by mouth daily as needed for mild constipation.   Past Week at Unknown time  . rosuvastatin (CRESTOR) 40 MG tablet Crestor 40 mg tablet  Take 1 tablet every day by oral route.   12/06/2016 at Unknown time  . silver sulfADIAZINE (SSD) 1 % cream SSD 1 % topical cream   12/07/2016 at Unknown time   Scheduled: . dextrose      . aspirin  81 mg Oral Daily  . carvedilol  3.125 mg Oral BID WC  . chlorhexidine  15 mL Mouth Rinse BID  . collagenase   Topical Daily  . docusate sodium  100 mg Oral  BID  . enoxaparin (LOVENOX) injection  40 mg Subcutaneous Q24H  . feeding supplement (ENSURE ENLIVE)  237 mL Oral BID BM  . insulin aspart  0-5 Units Subcutaneous QHS  . insulin aspart  0-9 Units Subcutaneous TID WC  . mouth rinse  15 mL Mouth Rinse q12n4p  . potassium chloride  40 mEq Oral Once  . rosuvastatin  40 mg Oral q1800  . silver sulfADIAZINE   Topical BID   Continuous: . sodium chloride    . lactated ringers 50 mL/hr at 12/07/16 2204  . piperacillin-tazobactam (ZOSYN)  IV Stopped (12/08/16 1223)  . vancomycin     BPZ:WCHENIDPOEUMP **OR** acetaminophen, ondansetron **OR** ondansetron (ZOFRAN) IV, polyethylene glycol   Allergy No Known Allergies   ROS:  A comprehensive review of systems was negative except for: Constitutional: positive for chills, fatigue, fevers and malaise Hematologic/lymphatic: positive for  anemia Musculoskeletal: positive for muscle weakness and left buttock draining wound   Blood pressure (!) 86/32, pulse (!) 59, temperature 98.9 F (37.2 C), temperature source Oral, resp. rate 18, height 5' 8" (1.727 m), weight 139 lb 12.4 oz (63.4 kg), SpO2 96 %. Physical Exam  Constitutional: He is oriented to person, place, and time and well-developed, well-nourished, and in no distress.  HENT:  Head: Normocephalic.  Eyes: Pupils are equal, round, and reactive to light.  Cardiovascular: Normal rate and regular rhythm.   Pulmonary/Chest: Effort normal and breath sounds normal.  Abdominal: Bowel sounds are normal. He exhibits no distension. There is no tenderness.  Genitourinary:  Genitourinary Comments: patulous anus with stool leakage, left ischium with foul smelling, yellow fibrinous ulcer with boggy tissue underneath, minimal sensation in the area  Musculoskeletal: Normal range of motion.  Neurological: He is alert and oriented to person, place, and time.  Skin: Skin is warm and dry.  Psychiatric: Mood, memory, affect and judgment normal.    Results: Results for orders placed or performed during the hospital encounter of 12/07/16 (from the past 48 hour(s))  Comprehensive metabolic panel     Status: Abnormal   Collection Time: 12/07/16  2:25 PM  Result Value Ref Range   Sodium 133 (L) 135 - 145 mmol/L   Potassium 3.5 3.5 - 5.1 mmol/L   Chloride 97 (L) 101 - 111 mmol/L   CO2 27 22 - 32 mmol/L   Glucose, Bld 177 (H) 65 - 99 mg/dL   BUN 41 (H) 6 - 20 mg/dL   Creatinine, Ser 1.34 (H) 0.61 - 1.24 mg/dL   Calcium 8.4 (L) 8.9 - 10.3 mg/dL   Total Protein 6.7 6.5 - 8.1 g/dL   Albumin 2.0 (L) 3.5 - 5.0 g/dL   AST 40 15 - 41 U/L   ALT 45 17 - 63 U/L   Alkaline Phosphatase 125 38 - 126 U/L   Total Bilirubin 0.2 (L) 0.3 - 1.2 mg/dL   GFR calc non Af Amer 49 (L) >60 mL/min   GFR calc Af Amer 56 (L) >60 mL/min    Comment: (NOTE) The eGFR has been calculated using the CKD EPI  equation. This calculation has not been validated in all clinical situations. eGFR's persistently <60 mL/min signify possible Chronic Kidney Disease.    Anion gap 9 5 - 15  CBC WITH DIFFERENTIAL     Status: Abnormal   Collection Time: 12/07/16  2:25 PM  Result Value Ref Range   WBC 11.4 (H) 4.0 - 10.5 K/uL   RBC 3.26 (L) 4.22 - 5.81 MIL/uL   Hemoglobin 9.6 (  L) 13.0 - 17.0 g/dL   HCT 29.5 (L) 39.0 - 52.0 %   MCV 90.5 78.0 - 100.0 fL   MCH 29.4 26.0 - 34.0 pg   MCHC 32.5 30.0 - 36.0 g/dL   RDW 13.3 11.5 - 15.5 %   Platelets 358 150 - 400 K/uL   Neutrophils Relative % 85 %   Neutro Abs 9.6 (H) 1.7 - 7.7 K/uL   Lymphocytes Relative 10 %   Lymphs Abs 1.2 0.7 - 4.0 K/uL   Monocytes Relative 5 %   Monocytes Absolute 0.6 0.1 - 1.0 K/uL   Eosinophils Relative 0 %   Eosinophils Absolute 0.0 0.0 - 0.7 K/uL   Basophils Relative 0 %   Basophils Absolute 0.0 0.0 - 0.1 K/uL  Procalcitonin     Status: None   Collection Time: 12/07/16  2:25 PM  Result Value Ref Range   Procalcitonin 0.51 ng/mL    Comment:        Interpretation: PCT > 0.5 ng/mL and <= 2 ng/mL: Systemic infection (sepsis) is possible, but other conditions are known to elevate PCT as well. (NOTE)         ICU PCT Algorithm               Non ICU PCT Algorithm    ----------------------------     ------------------------------         PCT < 0.25 ng/mL                 PCT < 0.1 ng/mL     Stopping of antibiotics            Stopping of antibiotics       strongly encouraged.               strongly encouraged.    ----------------------------     ------------------------------       PCT level decrease by               PCT < 0.25 ng/mL       >= 80% from peak PCT       OR PCT 0.25 - 0.5 ng/mL          Stopping of antibiotics                                             encouraged.     Stopping of antibiotics           encouraged.    ----------------------------     ------------------------------       PCT level decrease by               PCT >= 0.25 ng/mL       < 80% from peak PCT        AND PCT >= 0.5 ng/mL             Continuing antibiotics                                              encouraged.       Continuing antibiotics            encouraged.    ----------------------------     ------------------------------     PCT level increase compared  PCT > 0.5 ng/mL         with peak PCT AND          PCT >= 0.5 ng/mL             Escalation of antibiotics                                          strongly encouraged.      Escalation of antibiotics        strongly encouraged.   Protime-INR     Status: None   Collection Time: 12/07/16  2:25 PM  Result Value Ref Range   Prothrombin Time 14.9 11.4 - 15.2 seconds   INR 1.18   Hemoglobin A1c     Status: Abnormal   Collection Time: 12/07/16  2:25 PM  Result Value Ref Range   Hgb A1c MFr Bld 7.2 (H) 4.8 - 5.6 %    Comment: (NOTE) Pre diabetes:          5.7%-6.4% Diabetes:              >6.4% Glycemic control for   <7.0% adults with diabetes    Mean Plasma Glucose 159.94 mg/dL    Comment: Performed at [facility] [address], [place], [place] [address]  I-Stat CG4 Lactic Acid, ED     Status: Abnormal   Collection Time: 12/07/16  2:41 PM  Result Value Ref Range   Lactic Acid, Venous 2.40 (HH) 0.5 - 1.9 mmol/L  Blood Culture (routine x 2)     Status: None (Preliminary result)   Collection Time: 12/07/16  3:22 PM  Result Value Ref Range   Specimen Description LEFT ANTECUBITAL    Special Requests      BOTTLES DRAWN AEROBIC AND ANAEROBIC Blood Culture adequate volume   Culture NO GROWTH < 24 HOURS    Report Status PENDING   Blood Culture (routine x 2)     Status: None (Preliminary result)   Collection Time: 12/07/16  3:28 PM  Result Value Ref Range   Specimen Description RIGHT ANTECUBITAL    Special Requests      BOTTLES DRAWN AEROBIC AND ANAEROBIC Blood Culture adequate volume   Culture NO GROWTH < 24 HOURS    Report Status PENDING   POC  occult blood, ED     Status: None   Collection Time: 12/07/16  5:10 PM  Result Value Ref Range   Fecal Occult Bld NEGATIVE NEGATIVE  Lactic acid, plasma     Status: None   Collection Time: 12/07/16  9:21 PM  Result Value Ref Range   Lactic Acid, Venous 1.0 0.5 - 1.9 mmol/L  APTT     Status: Abnormal   Collection Time: 12/07/16  9:21 PM  Result Value Ref Range   aPTT 40 (H) 24 - 36 seconds    Comment:        IF BASELINE aPTT IS ELEVATED, SUGGEST PATIENT RISK ASSESSMENT BE USED TO DETERMINE APPROPRIATE ANTICOAGULANT THERAPY.   Glucose, capillary     Status: Abnormal   Collection Time: 12/07/16 10:02 PM  Result Value Ref Range   Glucose-Capillary 182 (H) 65 - 99 mg/dL   Comment 1 Notify RN    Comment 2 Document in Chart   Lactic acid, plasma     Status: None   Collection Time: 12/07/16 11:58 PM  Result Value Ref Range  Lactic Acid, Venous 1.2 0.5 - 1.9 mmol/L  Basic metabolic panel     Status: Abnormal   Collection Time: 12/08/16  5:44 AM  Result Value Ref Range   Sodium 132 (L) 135 - 145 mmol/L   Potassium 3.3 (L) 3.5 - 5.1 mmol/L   Chloride 99 (L) 101 - 111 mmol/L   CO2 23 22 - 32 mmol/L   Glucose, Bld 195 (H) 65 - 99 mg/dL   BUN 38 (H) 6 - 20 mg/dL   Creatinine, Ser 1.30 (H) 0.61 - 1.24 mg/dL   Calcium 7.8 (L) 8.9 - 10.3 mg/dL   GFR calc non Af Amer 51 (L) >60 mL/min   GFR calc Af Amer 59 (L) >60 mL/min    Comment: (NOTE) The eGFR has been calculated using the CKD EPI equation. This calculation has not been validated in all clinical situations. eGFR's persistently <60 mL/min signify possible Chronic Kidney Disease.    Anion gap 10 5 - 15  CBC     Status: Abnormal   Collection Time: 12/08/16  5:44 AM  Result Value Ref Range   WBC 11.2 (H) 4.0 - 10.5 K/uL   RBC 2.84 (L) 4.22 - 5.81 MIL/uL   Hemoglobin 8.3 (L) 13.0 - 17.0 g/dL   HCT 25.5 (L) 39.0 - 52.0 %   MCV 89.8 78.0 - 100.0 fL   MCH 29.2 26.0 - 34.0 pg   MCHC 32.5 30.0 - 36.0 g/dL   RDW 13.4 11.5 - 15.5 %    Platelets 305 150 - 400 K/uL  Glucose, capillary     Status: Abnormal   Collection Time: 12/08/16  8:00 AM  Result Value Ref Range   Glucose-Capillary 155 (H) 65 - 99 mg/dL   Comment 1 Notify RN    Comment 2 Document in Chart   Glucose, capillary     Status: Abnormal   Collection Time: 12/08/16 12:41 PM  Result Value Ref Range   Glucose-Capillary 176 (H) 65 - 99 mg/dL   Comment 1 Notify RN    Comment 2 Document in Chart   Glucose, capillary     Status: Abnormal   Collection Time: 12/08/16  4:53 PM  Result Value Ref Range   Glucose-Capillary 326 (H) 65 - 99 mg/dL    Dg Chest 2 View  Result Date: 12/07/2016 CLINICAL DATA:  Weakness. EXAM: CHEST  2 VIEW COMPARISON:  None. FINDINGS: Trachea is midline. Heart size within normal limits. Thoracic aorta is calcified. Minimal scarring at the left lung base, adjacent to an elevated left hemidiaphragm. Lungs are otherwise clear. No pleural fluid. There is flowing osteophytosis throughout the visualized spine. IMPRESSION: 1. No acute findings in the chest. 2. Flowing osteophytosis throughout the spine is indicative of ankylosing spondylitis. Electronically Signed   By: Lorin Picket M.D.   On: 12/07/2016 16:58     Assessment & Plan:  Jesse Lucas is a 82 y.o. male with left ischial pressure abscess that is foul smelling and unstagable at this time due to the boggy fibrinous material. He has been having fevers and chills and a leukocytosis, and presumed sepsis from this ulcer given no other source.   After exam, the patient was minimally sensate in the area, and a bedside sharp debridement was performed with verbal consent from the patient and family. (See separate note).  Final wound measuring 4X5X7cm toward the ischium.   -Keep packing in place for now, will change in the AM  -Expect some bloody drainage, but packing should minimize  and create pressure  -Continue antibiotics -Will follow and determine if further debridement needed at  bedside versus the OR   All questions were answered to the satisfaction of the patient and family.   Virl Cagey 12/08/2016, 5:27 PM

## 2016-12-08 NOTE — Care Management Note (Addendum)
Case Management Note  Patient Details  Name: Jesse Lucas MRN: [id number] Date of Birth: [birth year]  Subjective/Objective:     Adm with sepsis, probable source wound on left buttock per notes. From home with wife, wife helps him with ADL's. He does walk some with a RW (only short distances). Active with [facility] for home health.                Action/Plan: Recommended for SNF. CSW consulted. CM will follow.    Expected Discharge Date:     12/08/2016             Expected Discharge Plan:  Skilled Nursing Facility  In-House Referral:  Clinical Social Work  Discharge planning Services  CM Consult  Post Acute Care Choice:    Choice offered to:     DME Arranged:    DME Agency:     HH Arranged:    HH Agency:     Status of Service:  In process, will continue to follow  If discussed at Long Length of Stay Meetings, dates discussed:    Additional Comments:  Deondrae Mcgrail, Chrystine Oiler, RN 12/08/2016, 12:17 PM

## 2016-12-08 NOTE — Progress Notes (Signed)
Called by RN the patient was not responsive after he had bedside debridement of buttock ulcer.  Came to see patient, he is alert and oriented 3, denies any complaints. Blood pressure is soft. We'll start IV normal saline at 125 mL per hour Transferred to stepdown for close monitoring of blood pressure Continue IV antibiotics vancomycin and Zosyn. Awaiting culture results.

## 2016-12-08 NOTE — Procedures (Addendum)
[facility] Procedure Note  12/08/16   Procedure(s) Performed:  Excisional debridement; Sharp incision and drainage of 4X5X7cm ischia pressure sore with necrotic infected tissue    Surgeon: Leatrice Jewels. Henreitta Leber, MD   Assistants: None   Anesthesia: None, no sensation in area     Complications: None   Wound Class: Dirty    Indications: 82 yo male with sepsis and left un-stageable ischial ulcer that has purulent drainage and is boggy.  Verbal consent was obtained from the patient and his family. They verified he is not on any plavix or coumadin or other blood thinner other than aspirin.  Findings: Undermining of ischial pressure ulcer measuring 4X5X7cm, down to the bone, Stage IV    Procedure: The patient was examined at the bedside and found to have minimal sensation.  The area was cleaned with saline and the fibrinous exudate roof was removed from the wound.  Necrotic, yellow purulent drainage and tissue was removed down to bleeding healthy tissue.  Sharp excisional debridement was performed using a scalpel.  The wound edges and skin edges were removed to bleeding skin, and the subcutaneous tissue and muscle were debrided to bleeding tissue.  The ulcer tracked down to the bone in this area.  There appears to be periosteum overlying the area that is exposed at this time.    Final inspection revealed acceptable hemostasis, and the wound was packed tightly with kerlix.  An ABD was placed over the area and paper tape. The patient tolerated the procedure and was talking and oriented the entire procedure. His family was in the room.    Algis Greenhouse, MD [facility] [address] [place], [place] [address] [phone] (office)

## 2016-12-08 NOTE — Evaluation (Signed)
Physical Therapy Evaluation Patient Details Name: Jesse Lucas MRN: [id number] DOB: 01-Aug-1934 Today's Date: 12/08/2016   History of Present Illness   Jesse Lucas is a 82 y.o. male with medical history significant of PAD, HLD, and CAD presenting with weakness.  For the last week or two has been very weak, no appetite, freezing cold.  He had a fever for a few days , subjective.  At one point it was 100-101.  +nausea from the antibiotic he was taking, cefuroxime for ? - HHN thought that he needed it because his "urine was like pudding, it was thick.  Urine was coming back that he did not".  They called the PCP today and he suggested bringing him in to the ER.  They thought his Hgb was low and they wanted that checked.  He has an ulcer on his bottom, started as a 1 cm lesion and now 10 cm.  Draining with infection.  He is very sedentary.  It had some black in it Monday, concern for blood in the wound.  HHN is working with it but it is in a very hard place.  His wife thought it looked better today "but it is draining like crazy".  Also with an ulcer on his foot, followed by podiatry and not bad.     Clinical Impression  Patient limited for taking steps due to fatigue and BLE weakness demonstrating slow labored movement with high risk for falls.  Patient will benefit from continued physical therapy in hospital and recommended venue below to increase strength, balance, endurance for safe ADLs and gait.    Follow Up Recommendations SNF;Supervision/Assistance - 24 hour    Equipment Recommendations  None recommended by PT    Recommendations for Other Services       Precautions / Restrictions Precautions Precautions: Fall Restrictions Weight Bearing Restrictions: No      Mobility  Bed Mobility Overal bed mobility: Needs Assistance Bed Mobility: Supine to Sit;Sit to Supine     Supine to sit: Min guard Sit to supine: Min guard      Transfers Overall transfer level: Needs  assistance Equipment used: Rolling walker (2 wheeled) Transfers: Sit to/from [employer] Sit to Stand: Mod assist Stand pivot transfers: Mod assist       General transfer comment: slow labored movement with poor standing balance  Ambulation/Gait Ambulation/Gait assistance: Mod assist Ambulation Distance (Feet): 5 Feet Assistive device: Rolling walker (2 wheeled) Gait Pattern/deviations: Decreased step length - right;Decreased step length - left;Decreased stance time - right;Decreased stance time - left;Decreased stride length   Gait velocity interpretation: Below normal speed for age/gender General Gait Details: Patient limited to a few steps to transfer back to bed, very unsteady on feet  Stairs            Wheelchair Mobility    Modified Rankin (Stroke Patients Only)       Balance Overall balance assessment: Needs assistance Sitting-balance support: Bilateral upper extremity supported;Feet supported Sitting balance-Leahy Scale: Fair     Standing balance support: Bilateral upper extremity supported;During functional activity Standing balance-Leahy Scale: Poor                               Pertinent Vitals/Pain Pain Assessment: 0-10 Pain Score: 2  Pain Location: left hip Pain Descriptors / Indicators: Aching Pain Intervention(s): Limited activity within patient's tolerance;Monitored during session    Home Living Family/patient expects to be discharged to:: Private residence  Living Arrangements: Spouse/significant other Available Help at Discharge: Family;Available 24 hours/day Type of Home: House Home Access: Stairs to enter Entrance Stairs-Rails: Right;Left;Can reach both Entrance Stairs-Number of Steps: 1 step at front, 4 at side of house, has basement with 8 steps with right siderail, but does not go into basement Home Layout: Two level Home Equipment: Walker - 2 wheels;Cane - single point;Wheelchair - manual;Shower  seat;Bedside commode      Prior Function Level of Independence: Independent with assistive device(s)               Hand Dominance   Dominant Hand: Right    Extremity/Trunk Assessment   Upper Extremity Assessment Upper Extremity Assessment: Defer to OT evaluation    Lower Extremity Assessment Lower Extremity Assessment: Generalized weakness    Cervical / Trunk Assessment Cervical / Trunk Assessment: Kyphotic  Communication   Communication: No difficulties  Cognition Arousal/Alertness: Awake/alert Behavior During Therapy: WFL for tasks assessed/performed Overall Cognitive Status: Within Functional Limits for tasks assessed                                        General Comments      Exercises     Assessment/Plan    PT Assessment Patient needs continued PT services  PT Problem List Decreased strength;Decreased activity tolerance;Decreased balance;Decreased mobility       PT Treatment Interventions Gait training;Stair training;Functional mobility training;Therapeutic activities;Therapeutic exercise;Patient/family education    PT Goals (Current goals can be found in the Care Plan section)  Acute Rehab PT Goals Patient Stated Goal: return home with family to assist PT Goal Formulation: With patient Time For Goal Achievement: 12/22/16 Potential to Achieve Goals: Good    Frequency Min 3X/week   Barriers to discharge        Co-evaluation               AM-PAC PT "6 Clicks" Daily Activity  Outcome Measure Difficulty turning over in bed (including adjusting bedclothes, sheets and blankets)?: A Lot Difficulty moving from lying on back to sitting on the side of the bed? : A Little Difficulty sitting down on and standing up from a chair with arms (e.g., wheelchair, bedside commode, etc,.)?: A Lot Help needed moving to and from a bed to chair (including a wheelchair)?: A Lot Help needed walking in hospital room?: A Lot Help needed  climbing 3-5 steps with a railing? : Total 6 Click Score: 12    End of Session Equipment Utilized During Treatment: Gait belt Activity Tolerance: Patient limited by fatigue Patient left: in bed;with call bell/phone within reach Nurse Communication: Mobility status PT Visit Diagnosis: Unsteadiness on feet (R26.81);Other abnormalities of gait and mobility (R26.89);Muscle weakness (generalized) (M62.81)    Time: 1610-9604 PT Time Calculation (min) (ACUTE ONLY): 28 min   Charges:   PT Evaluation $PT Eval Low Complexity: 1 Low PT Treatments $Therapeutic Activity: 23-37 mins   PT G Codes:        1:58 PM, 01-05-17 Ocie Bob, MPT Physical Therapist with [facility] [phone] office [phone] mobile phone

## 2016-12-09 LAB — GLUCOSE, CAPILLARY
GLUCOSE-CAPILLARY: 184 mg/dL — AB (ref 65–99)
GLUCOSE-CAPILLARY: 177 mg/dL — AB (ref 65–99)
Glucose-Capillary: 108 mg/dL — ABNORMAL HIGH (ref 65–99)
Glucose-Capillary: 150 mg/dL — ABNORMAL HIGH (ref 65–99)

## 2016-12-09 LAB — BASIC METABOLIC PANEL
Anion gap: 9 (ref 5–15)
BUN: 33 mg/dL — AB (ref 6–20)
CALCIUM: 7.8 mg/dL — AB (ref 8.9–10.3)
CO2: 25 mmol/L (ref 22–32)
CREATININE: 1.29 mg/dL — AB (ref 0.61–1.24)
Chloride: 104 mmol/L (ref 101–111)
GFR calc Af Amer: 59 mL/min — ABNORMAL LOW (ref >60)
GFR, EST NON AFRICAN AMERICAN: 51 mL/min — AB (ref >60)
GLUCOSE: 86 mg/dL (ref 65–99)
POTASSIUM: 3.6 mmol/L (ref 3.5–5.1)
Sodium: 138 mmol/L (ref 135–145)

## 2016-12-09 LAB — CBC
HCT: 23.8 % — ABNORMAL LOW (ref 39.0–52.0)
Hemoglobin: 7.7 g/dL — ABNORMAL LOW (ref 13.0–17.0)
MCH: 29.3 pg (ref 26.0–34.0)
MCHC: 32.4 g/dL (ref 30.0–36.0)
MCV: 90.5 fL (ref 78.0–100.0)
PLATELETS: 278 10*3/uL (ref 150–400)
RBC: 2.63 MIL/uL — ABNORMAL LOW (ref 4.22–5.81)
RDW: 13.6 % (ref 11.5–15.5)
WBC: 7.9 10*3/uL (ref 4.0–10.5)

## 2016-12-09 MED ORDER — SODIUM CHLORIDE 0.9 % IV BOLUS (SEPSIS)
500.0000 mL | Freq: Once | INTRAVENOUS | Status: AC
  Administered 2016-12-09: 500 mL via INTRAVENOUS

## 2016-12-09 NOTE — Progress Notes (Signed)
OT Cancellation Note  Patient Details Name: Jesse Lucas MRN: [id number] DOB: May 15, 1934   Cancelled Treatment:    Reason Eval/Treat Not Completed: Medical issues which prohibited therapy. Per chart review: pt transferred to step-down unit after non-responsive episode. Per [facility] policy pt has had a change in medical status and will be discharged from rehab services at this time. New orders required for resumption of services when pt medically ready.  OT will sign off.    Ezra Sites, OTR/L  [phone] 12/09/2016, 7:07 AM

## 2016-12-09 NOTE — Care Management Important Message (Signed)
Important Message  Patient Details  Name: Jesse Lucas MRN: [id number] Date of Birth: 1935/02/20   Medicare Important Message Given:  Yes    Malcolm Metro, RN 12/09/2016, 12:31 PM

## 2016-12-09 NOTE — NC FL2 (Signed)
[place] MEDICAID FL2 LEVEL OF CARE SCREENING TOOL     IDENTIFICATION  Patient Name: Jesse Lucas Birthdate: 07-23-1934 Sex: male Admission Date (Current Location): 12/07/2016  [facility] and [place] Number:  [employer] and Address:  [facility],  [address], [place] [address]      Provider Number: [id number]  Attending Physician Name and Address:  Meredeth Ide, MD  Relative Name and Phone Number:       Current Level of Care: Hospital Recommended Level of Care: Skilled Nursing Facility Prior Approval Number:    Date Approved/Denied:   PASRR Number:    Discharge Plan: SNF    Current Diagnoses: Patient Active Problem List   Diagnosis Date Noted  . Decubitus ulcer of left ischial area, stage IV (HCC)   . Sepsis (HCC) 12/07/2016  . Unstageable pressure ulcer of sacral region (HCC) 12/07/2016  . CAD (coronary artery disease) 12/07/2016  . PAD (peripheral artery disease) (HCC) 12/07/2016  . Hyperlipidemia 12/07/2016  . Severe protein-calorie malnutrition (HCC) 12/07/2016  . AKI (acute kidney injury) (HCC) 12/07/2016  . Tobacco dependence 12/07/2016  . Pre-operative cardiovascular examination 07/01/2016  .  HIGH RISK MYOVIEW 06/24/2016    Orientation RESPIRATION BLADDER Height & Weight     Self, Situation, Place, Time  Normal Continent Weight: 144 lb 13.5 oz (65.7 kg) Height:   (172.7 cm)  BEHAVIORAL SYMPTOMS/MOOD NEUROLOGICAL BOWEL NUTRITION STATUS      Continent Diet (heart healthy/carb modified.)  AMBULATORY STATUS COMMUNICATION OF NEEDS Skin   Limited Assist Verbally PU Stage and Appropriate Care (Stage II: heel, rt; unstagable, buttocks, left)                       Personal Care Assistance Level of Assistance  Bathing, Feeding, Dressing Bathing Assistance: Limited assistance Feeding assistance: Independent Dressing Assistance: Limited assistance     Functional Limitations Info  Hearing, Sight, Speech Sight Info:  Adequate Hearing Info: Adequate Speech Info: Adequate    SPECIAL CARE FACTORS FREQUENCY  PT (By licensed PT), OT (By licensed OT)     PT Frequency: 5x/week OT Frequency: 3x/week            Contractures Contractures Info: Not present    Additional Factors Info  Code Status Code Status Info: Full Code             Current Medications (12/09/2016):  This is the current hospital active medication list Current Facility-Administered Medications  Medication Dose Route Frequency Provider Last Rate Last Dose  . 0.9 %  sodium chloride infusion   Intravenous Continuous Meredeth Ide, MD 125 mL/hr at 12/09/16 0103    . acetaminophen (TYLENOL) tablet 650 mg  650 mg Oral Q6H PRN Jonah Blue, MD       Or  . acetaminophen (TYLENOL) suppository 650 mg  650 mg Rectal Q6H PRN Jonah Blue, MD      . aspirin chewable tablet 81 mg  81 mg Oral Daily Jonah Blue, MD   81 mg at 12/09/16 1158  . carvedilol (COREG) tablet 3.125 mg  3.125 mg Oral BID WC Jonah Blue, MD   3.125 mg at 12/09/16 0840  . chlorhexidine (PERIDEX) 0.12 % solution 15 mL  15 mL Mouth Rinse BID Jonah Blue, MD   15 mL at 12/09/16 1159  . collagenase (SANTYL) ointment   Topical Daily Sharl Ma, Sarina Ill, MD      . docusate sodium (COLACE) capsule 100 mg  100 mg  Oral BID Jonah Blue, MD   100 mg at 12/09/16 1158  . enoxaparin (LOVENOX) injection 40 mg  40 mg Subcutaneous Q24H Jonah Blue, MD   40 mg at 12/08/16 2140  . feeding supplement (ENSURE ENLIVE) (ENSURE ENLIVE) liquid 237 mL  237 mL Oral BID BM Jonah Blue, MD   237 mL at 12/08/16 1000  . insulin aspart (novoLOG) injection 0-5 Units  0-5 Units Subcutaneous QHS Jonah Blue, MD   2 Units at 12/08/16 2144  . insulin aspart (novoLOG) injection 0-9 Units  0-9 Units Subcutaneous TID WC Jonah Blue, MD   2 Units at 12/09/16 1241  . lactated ringers infusion   Intravenous Continuous Jonah Blue, MD 50 mL/hr at 12/07/16 2204    . MEDLINE mouth  rinse  15 mL Mouth Rinse q12n4p Jonah Blue, MD      . ondansetron [facility]) tablet 4 mg  4 mg Oral Q6H PRN Jonah Blue, MD       Or  . ondansetron [facility]) injection 4 mg  4 mg Intravenous Q6H PRN Jonah Blue, MD      . piperacillin-tazobactam (ZOSYN) IVPB 3.375 g  3.375 g Intravenous Christiana Fuchs, MD   Stopped at 12/09/16 1239  . polyethylene glycol (MIRALAX / GLYCOLAX) packet 17 g  17 g Oral Daily PRN Jonah Blue, MD      . potassium chloride SA (K-DUR,KLOR-CON) CR tablet 40 mEq  40 mEq Oral Once [place], Sarina Ill, MD      . rosuvastatin (CRESTOR) tablet 40 mg  40 mg Oral q1800 Jonah Blue, MD   40 mg at 12/07/16 2204  . silver sulfADIAZINE (SILVADENE) 1 % cream   Topical BID Meredeth Ide, MD   1 application at 12/09/16 1000  . vancomycin (VANCOCIN) IVPB 750 mg/150 ml premix  750 mg Intravenous Q24H Linwood Dibbles, MD         Discharge Medications: Please see discharge summary for a list of discharge medications.  Relevant Imaging Results:  Relevant Lab Results:   Additional Information SSN [id number], Juleen China, LCSW

## 2016-12-09 NOTE — Progress Notes (Signed)
PT Cancellation Note  Patient Details Name: Jesse Lucas MRN: [id number] DOB: 1934/05/06   Cancelled Treatment:    Reason Eval/Treat Not Completed: Medical issues which prohibited therapy.  Patient transferred to a higher level of care and will need new physical therapy consult to reassess for functional mobility when patient is medically stable.  Thank you.   7:43 AM, 12/09/16 Ocie Bob, MPT Physical Therapist with [facility] [phone] office [phone] mobile phone
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
## 2016-12-09 NOTE — Progress Notes (Signed)
[facility]  PROGRESS NOTE  Nello Corro ZOX:[id number] DOB: December 14, 1934 DOA: 12/07/2016 PCP: Joaquin Courts, DO   Brief HPI:    82 y.o. male with medical history significant of PAD, HLD, and CAD presenting with weakness.  For the last week or two has been very weak, no appetite, freezing cold.  He had a fever for a few days , subjective.  At one point it was 100-101.  +nausea from the antibiotic he was taking, cefuroxime for ? - HHN thought that he needed it because his "urine was like pudding, it was thick.  Urine was coming back that he did not".  They called the PCP today and he suggested bringing him in to the ER.  They thought his Hgb was low and they wanted that checked.  He has an ulcer on his bottom, started as a 1 cm lesion and now 10 cm.  Draining with infection.  He is very sedentary.  It had some black in it Monday, concern for blood in the wound.  HHN is working with it but it is in a very hard place.  His wife thought it looked better today "but it is draining like crazy".  Also with an ulcer on his foot, followed by podiatry and not bad.    Subjective   Patient seen and examined, denies any complaints this morning. Status post development of ischial ulcer.   Assessment/Plan:     1. Sepsis-Resolved, due to large pressure ulcer on left buttock, patient came with leukocytosis, lactic 2.4 and hypotension. Started on sepsis protocol. Vancomycin and Zosyn started. W BC is 11.2. Lactic acid is down to 1.2. Will continue with antibiotics. Will change IV fluids to normal saline at 75 per hour 2. Buttock ulceration and foot ulcer- patient was seen by wound care nurse, she recommend surgical evaluation for possible debridement of the ulcer. Patient is status post debridement of the ischial ulcer. Continue wound care. Awaiting final culture results. 3. Acute kidney injury- likely from poor by mouth intake, started on gentle IV hydration. Patient's creatinine today is 1.29 Will  follow BMP in a.m. 4. Chronic anemia-patient's hemoglobin down to 7.7, came with hemoglobin of 9.5. Likely dilutional as patient received IV fluids for possible sepsis, his pulse to 4 L IV fluids. Previous hemoglobin from April/May was 11.1. Follow CBC in a.m. Transfuse as needed for hemoglobin less than 7. 5. CAD/PVD/hyperlipidemia-continue aspirin, Coreg, Crestor     DVT prophylaxis: Lovenox  Code Status: Full code  Family Communication: No family present at bedside   Disposition Plan: Pending improvement in  clinical condition   Consultants:  None  Procedures:  None  Continuous infusions . sodium chloride 125 mL/hr at 12/09/16 0103  . lactated ringers 50 mL/hr at 12/07/16 2204  . piperacillin-tazobactam (ZOSYN)  IV Stopped (12/09/16 1239)  . vancomycin        Antibiotics:   Anti-infectives    Start     Dose/Rate Route Frequency Ordered Stop   12/08/16 1600  vancomycin (VANCOCIN) IVPB 750 mg/150 ml premix     750 mg 150 mL/hr over 60 Minutes Intravenous Every 24 hours 12/07/16 2108     12/08/16 0000  piperacillin-tazobactam (ZOSYN) IVPB 3.375 g     3.375 g 12.5 mL/hr over 240 Minutes Intravenous Every 8 hours 12/07/16 2108     12/07/16 1515  piperacillin-tazobactam (ZOSYN) IVPB 3.375 g     3.375 g 100 mL/hr over 30 Minutes Intravenous  Once 12/07/16 1507 12/07/16 1652  12/07/16 1515  vancomycin (VANCOCIN) IVPB 1000 mg/200 mL premix     1,000 mg 200 mL/hr over 60 Minutes Intravenous  Once 12/07/16 1507 12/07/16 1759       Objective   Vitals:   12/09/16 1100 12/09/16 1200 12/09/16 1300 12/09/16 1400  BP: (!) 107/47 (!) 137/59 (!) 115/57 (!) 95/49  Pulse: (!) 51 (!) 58 (!) 54   Resp: (!) 22 (!) 26 (!) 26   Temp:      TempSrc:      SpO2: 97% 96% 96%   Weight:      Height:        Intake/Output Summary (Last 24 hours) at 12/09/16 1513 Last data filed at 12/09/16 0800  Gross per 24 hour  Intake          1885.42 ml  Output              600 ml  Net           1285.42 ml   Filed Weights   12/07/16 1402 12/07/16 2100 12/09/16 0500  Weight: 62.6 kg (138 lb) 63.4 kg (139 lb 12.4 oz) 65.7 kg (144 lb 13.5 oz)     Physical Examination:   Physical Exam:  Neck: Supple, no deformities, masses, or tenderness Lungs: Normal respiratory effort, bilateral clear to auscultation, no crackles or wheezes.  Heart: Regular rate and rhythm, S1 and S2 normal, no murmurs, rubs auscultated Abdomen: BS normoactive,soft,nondistended,non-tender to palpation,no organomegaly Extremities: No pretibial edema, no erythema, no cyanosis, no clubbing Neuro : Alert and oriented to time, place and person, No focal deficits  Skin: Buttock and leg wound in  dressing    Data Reviewed: I have personally reviewed following labs and imaging studies  CBG:  Recent Labs Lab 12/08/16 1241 12/08/16 1653 12/08/16 2050 12/09/16 0748 12/09/16 1237  GLUCAP 176* 326* 224* 108* 184*    CBC:  Recent Labs Lab 12/07/16 1425 12/08/16 0544 12/09/16 0414  WBC 11.4* 11.2* 7.9  NEUTROABS 9.6*  --   --   HGB 9.6* 8.3* 7.7*  HCT 29.5* 25.5* 23.8*  MCV 90.5 89.8 90.5  PLT 358 305 278    Basic Metabolic Panel:  Recent Labs Lab 12/07/16 1425 12/08/16 0544 12/09/16 0414  NA 133* 132* 138  K 3.5 3.3* 3.6  CL 97* 99* 104  CO2 GLUCOSE 177* 195* 86  BUN 41* 38* 33*  CREATININE 1.34* 1.30* 1.29*  CALCIUM 8.4* 7.8* 7.8*    Recent Results (from the past 240 hour(s))  Blood Culture (routine x 2)     Status: None (Preliminary result)   Collection Time: 12/07/16  3:22 PM  Result Value Ref Range Status   Specimen Description LEFT ANTECUBITAL  Final   Special Requests   Final    BOTTLES DRAWN AEROBIC AND ANAEROBIC Blood Culture adequate volume   Culture NO GROWTH 2 DAYS  Final   Report Status PENDING  Incomplete  Blood Culture (routine x 2)     Status: None (Preliminary result)   Collection Time: 12/07/16  3:28 PM  Result Value Ref Range Status    Specimen Description RIGHT ANTECUBITAL  Final   Special Requests   Final    BOTTLES DRAWN AEROBIC AND ANAEROBIC Blood Culture adequate volume   Culture NO GROWTH 2 DAYS  Final   Report Status PENDING  Incomplete     Liver Function Tests:  Recent Labs Lab 12/07/16 1425  AST 40  ALT 45  ALKPHOS 125  BILITOT 0.2*  PROT 6.7  ALBUMIN 2.0*     Studies: Dg Chest 2 View  Result Date: 12/07/2016 CLINICAL DATA:  Weakness. EXAM: CHEST  2 VIEW COMPARISON:  None. FINDINGS: Trachea is midline. Heart size within normal limits. Thoracic aorta is calcified. Minimal scarring at the left lung base, adjacent to an elevated left hemidiaphragm. Lungs are otherwise clear. No pleural fluid. There is flowing osteophytosis throughout the visualized spine. IMPRESSION: 1. No acute findings in the chest. 2. Flowing osteophytosis throughout the spine is indicative of ankylosing spondylitis. Electronically Signed   By: Leanna Battles M.D.   On: 12/07/2016 16:58    Scheduled Meds: . aspirin  81 mg Oral Daily  . carvedilol  3.125 mg Oral BID WC  . chlorhexidine  15 mL Mouth Rinse BID  . collagenase   Topical Daily  . docusate sodium  100 mg Oral BID  . enoxaparin (LOVENOX) injection  40 mg Subcutaneous Q24H  . feeding supplement (ENSURE ENLIVE)  237 mL Oral BID BM  . insulin aspart  0-5 Units Subcutaneous QHS  . insulin aspart  0-9 Units Subcutaneous TID WC  . mouth rinse  15 mL Mouth Rinse q12n4p  . potassium chloride  40 mEq Oral Once  . rosuvastatin  40 mg Oral q1800  . silver sulfADIAZINE   Topical BID      Time spent: 25 min  [facility]   [facility] Pager [phone]. If 7PM-7AM, please contact night-coverage at [email or web address], Office  [phone]  password [email or web address]  12/09/2016, 3:13 PM  LOS: 2 days

## 2016-12-09 NOTE — Progress Notes (Signed)
[facility] Progress Note     Subjective: No major issues after transfer to stepdown. Doing well.   Objective: Vital signs in last 24 hours: Temp:  [97.7 F (36.5 C)-98.5 F (36.9 C)] 97.7 F (36.5 C) (10/12 0800) Pulse Rate:  [43-61] 48 (10/12 1500) Resp:  [20-29] 20 (10/12 1500) BP: (77-137)/(29-65) 113/65 (10/12 1500) SpO2:  [92 %-97 %] 95 % (10/12 1500) Weight:  [144 lb 13.5 oz (65.7 kg)] 144 lb 13.5 oz (65.7 kg) (10/12 0500) Last BM Date: 12/08/16  Intake/Output from previous day: 10/11 0701 - 10/12 0700 In: 1510.4 [I.V.:1360.4; IV Piggyback:150] Out: 600 [Urine:600] Intake/Output this shift: Total I/O In: 575 [I.V.:375; IV Piggyback:200] Out: -   Physical Exam left ischial pressure ulcer dressing removed, packing lightly blood colored, no foul ordor or drainage, 7cm upward into buttock, 4cm down to ischium, 4 cm wide  Lab Results:   Recent Labs  12/08/16 0544 12/09/16 0414  WBC 11.2* 7.9  HGB 8.3* 7.7*  HCT 25.5* 23.8*  PLT 305 278   BMET  Recent Labs  12/08/16 0544 12/09/16 0414  NA 132* 138  K 3.3* 3.6  CL 99* 104  CO2 23 25  GLUCOSE 195* 86  BUN 38* 33*  CREATININE 1.30* 1.29*  CALCIUM 7.8* 7.8*   PT/INR  Recent Labs  12/07/16 1425  LABPROT 14.9  INR 1.18    Studies/Results: Dg Chest 2 View  Result Date: 12/07/2016 CLINICAL DATA:  Weakness. EXAM: CHEST  2 VIEW COMPARISON:  None. FINDINGS: Trachea is midline. Heart size within normal limits. Thoracic aorta is calcified. Minimal scarring at the left lung base, adjacent to an elevated left hemidiaphragm. Lungs are otherwise clear. No pleural fluid. There is flowing osteophytosis throughout the visualized spine. IMPRESSION: 1. No acute findings in the chest. 2. Flowing osteophytosis throughout the spine is indicative of ankylosing spondylitis. Electronically Signed   By: Leanna Battles M.D.   On: 12/07/2016 16:58    Anti-infectives: Anti-infectives    Start      Dose/Rate Route Frequency Ordered Stop   12/08/16 1600  vancomycin (VANCOCIN) IVPB 750 mg/150 ml premix     750 mg 150 mL/hr over 60 Minutes Intravenous Every 24 hours 12/07/16 2108     12/08/16 0000  piperacillin-tazobactam (ZOSYN) IVPB 3.375 g     3.375 g 12.5 mL/hr over 240 Minutes Intravenous Every 8 hours 12/07/16 2108     12/07/16 1515  piperacillin-tazobactam (ZOSYN) IVPB 3.375 g     3.375 g 100 mL/hr over 30 Minutes Intravenous  Once 12/07/16 1507 12/07/16 1652   12/07/16 1515  vancomycin (VANCOCIN) IVPB 1000 mg/200 mL premix     1,000 mg 200 mL/hr over 60 Minutes Intravenous  Once 12/07/16 1507 12/07/16 1759      Assessment/Plan: Mr Kerner is a 82 yo with a Stage IV left ischial pressure ulcer s/p debridement at the bedside.  -BID dressing changes with kerlix, santyl to the base once daily before dressing change  -Packing needs to be in the entire wound measuring 7X4X4cm    LOS: 2 days    Lucretia Roers 12/09/2016

## 2016-12-09 NOTE — Progress Notes (Signed)
BP and HR low, 70/30s and 40s, respectively, MD notified orders received for bolus.

## 2016-12-10 DIAGNOSIS — D649 Anemia, unspecified: Secondary | ICD-10-CM

## 2016-12-10 DIAGNOSIS — L89324 Pressure ulcer of left buttock, stage 4: Secondary | ICD-10-CM

## 2016-12-10 DIAGNOSIS — E43 Unspecified severe protein-calorie malnutrition: Secondary | ICD-10-CM

## 2016-12-10 LAB — IRON AND TIBC
Iron: 45 ug/dL (ref 45–182)
Saturation Ratios: 26 % (ref 17.9–39.5)
TIBC: 171 ug/dL — ABNORMAL LOW (ref 250–450)
UIBC: 126 ug/dL

## 2016-12-10 LAB — GLUCOSE, CAPILLARY
GLUCOSE-CAPILLARY: 111 mg/dL — AB (ref 65–99)
GLUCOSE-CAPILLARY: 169 mg/dL — AB (ref 65–99)
GLUCOSE-CAPILLARY: 129 mg/dL — AB (ref 65–99)
Glucose-Capillary: 138 mg/dL — ABNORMAL HIGH (ref 65–99)

## 2016-12-10 LAB — CBC
HCT: 26 % — ABNORMAL LOW (ref 39.0–52.0)
Hemoglobin: 8.3 g/dL — ABNORMAL LOW (ref 13.0–17.0)
MCH: 29 pg (ref 26.0–34.0)
MCHC: 31.9 g/dL (ref 30.0–36.0)
MCV: 90.9 fL (ref 78.0–100.0)
PLATELETS: 311 10*3/uL (ref 150–400)
RBC: 2.86 MIL/uL — AB (ref 4.22–5.81)
RDW: 13.6 % (ref 11.5–15.5)
WBC: 7.5 10*3/uL (ref 4.0–10.5)

## 2016-12-10 LAB — BASIC METABOLIC PANEL
Anion gap: 7 (ref 5–15)
BUN: 28 mg/dL — AB (ref 6–20)
CALCIUM: 7.7 mg/dL — AB (ref 8.9–10.3)
CO2: 24 mmol/L (ref 22–32)
CREATININE: 1.14 mg/dL (ref 0.61–1.24)
Chloride: 108 mmol/L (ref 101–111)
GFR calc Af Amer: >60 mL/min (ref >60)
GFR, EST NON AFRICAN AMERICAN: 59 mL/min — AB (ref >60)
GLUCOSE: 123 mg/dL — AB (ref 65–99)
POTASSIUM: 3.7 mmol/L (ref 3.5–5.1)
SODIUM: 139 mmol/L (ref 135–145)

## 2016-12-10 LAB — VITAMIN B12: Vitamin B-12: 747 pg/mL (ref 180–914)

## 2016-12-10 LAB — MRSA PCR SCREENING: MRSA BY PCR: NEGATIVE

## 2016-12-10 MED ORDER — VANCOMYCIN HCL 10 G IV SOLR
1250.0000 mg | INTRAVENOUS | Status: DC
  Administered 2016-12-10 – 2016-12-11 (×2): 1250 mg via INTRAVENOUS
  Filled 2016-12-10 (×3): qty 1250

## 2016-12-10 NOTE — Progress Notes (Signed)
Pharmacy Antibiotic Note  Jesse Lucas is a 82 y.o. male admitted on 12/07/2016 with cellulitis.  Pharmacy has been consulted for vancomycin and zosyn dosing. Cultures are negative to date.  Renal function has improved  Plan: Change vancomycin to 1250 mg IV q24 hours Continue zosyn 3.375 gm IV q8 hours F/u renal function, cultures and clinical course  Height:  (172.7 cm) Weight: 153 lb (69.4 kg) IBW/kg (Calculated) : 68.4  Temp (24hrs), Avg:98.3 F (36.8 C), Min:97.8 F (36.6 C), Max:99.2 F (37.3 C)   Recent Labs Lab 12/07/16 1425 12/07/16 1441 12/07/16 2121 12/07/16 2358 12/08/16 0544 12/09/16 0414 12/10/16 0520  WBC 11.4*  --   --   --  11.2* 7.9 7.5  CREATININE 1.34*  --   --   --  1.30* 1.29* 1.14  LATICACIDVEN  --  2.40* 1.0 1.2  --   --   --     Estimated Creatinine Clearance: 50.8 mL/min (by C-G formula based on SCr of 1.14 mg/dL).    No Known Allergies   Thank you for allowing pharmacy to be a part of this patient's care.  Talbert Cage Poteet 12/10/2016 11:07 AM

## 2016-12-10 NOTE — Progress Notes (Signed)
PROGRESS NOTE                                                                                                                                                                                                             Patient Demographics:    Jesse Lucas, is a 82 y.o. male, DOB - 16-Jun-1934, WJX:[id number]  Admit date - 12/07/2016   Admitting Physician Jonah Blue, MD  Outpatient Primary MD for the patient is Joaquin Courts, DO  LOS - 3  Outpatient Specialists:   Chief Complaint  Patient presents with  . Weakness       Brief Narrative   82 year old male with coronary artery disease, peripheral artery disease presented with generalized weakness and subjective fever.Marland Kitchen He also has a buttock wound that was draining purulent material. Patient was placed on cefuroxime and by his? PCP. He had outpatient blood will done and was told that he was anemic and needed to come to the ED. Patient was found to be septic with elevated WBC, tachypnea and elevated lactic acid possibly from the left buttock wound. Admitted to stepdown unit with empiric IV antibiotic and surgery consulted.   Subjective:    No overnight events. Denies any pain. Remains afebrile.   Assessment  & Plan :    Principal Problem:   Sepsis (HCC) Secondary to infected left buttock pressure ulcer. Sepsis resolved. Continue empiric vancomycin and Zosyn. Blood cultures negative. Surgery consult appreciated. Bedside debridement of the ulcer done with packing.  Active Problems: stage IV left ischial pressure ulcer Large ulcer measuring 744 cm tunneling deep inside. Status post bedside debridement. Continue empiric antibiotics for now. Surgery recommends twice a day dressing changes with Kerlix, Santyl to the base once daily before dressing change.    CAD (coronary artery disease)   PAD (peripheral artery disease) (HCC) Continue aspirin, beta blocker  and statin.    Severe protein-calorie malnutrition (HCC) Get nutrition consult.    AKI (acute kidney injury) (HCC)  secondary to sepsis and prerenal with dehydration. Improved with IV fluids.  Anemia  Hemoccult negative. Check iron panel. Baseline hemoglobin of 11.  Diabetes mellitus type 2 A1c of 7.2. Not on any home medication. Monitor on sliding scale coverage.      Code Status : Full code  Family Communication  : None at bedside  Disposition Plan  :  Transfer to telemetry. PT evaluation.  Barriers For Discharge : Active symptoms  Consults  :  Surgery  Procedures  : Incision and debridement of the ischial ulcer  DVT Prophylaxis  : Lovenox  Lab Results  Component Value Date   PLT 311 12/10/2016    Antibiotics  :    Anti-infectives    Start     Dose/Rate Route Frequency Ordered Stop   12/08/16 1600  vancomycin (VANCOCIN) IVPB 750 mg/150 ml premix     750 mg 150 mL/hr over 60 Minutes Intravenous Every 24 hours 12/07/16 2108     12/08/16 0000  piperacillin-tazobactam (ZOSYN) IVPB 3.375 g     3.375 g 12.5 mL/hr over 240 Minutes Intravenous Every 8 hours 12/07/16 2108     12/07/16 1515  piperacillin-tazobactam (ZOSYN) IVPB 3.375 g     3.375 g 100 mL/hr over 30 Minutes Intravenous  Once 12/07/16 1507 12/07/16 1652   12/07/16 1515  vancomycin (VANCOCIN) IVPB 1000 mg/200 mL premix     1,000 mg 200 mL/hr over 60 Minutes Intravenous  Once 12/07/16 1507 12/07/16 1759        Objective:   Vitals:   12/10/16 0400 12/10/16 0500 12/10/16 0600 12/10/16 0737  BP: (!) 143/48 (!) 130/50 (!) 114/49   Pulse: (!) 49 (!) 59 (!) 53 (!) 58  Resp: 12 14 (!) 22 (!) 24  Temp: 98.4 F (36.9 C)   98 F (36.7 C)  TempSrc: Oral   Oral  SpO2: 93% (!) 78% 98% 100%  Weight: 69.4 kg (153 lb)     Height:        Wt Readings from Last 3 Encounters:  12/10/16 69.4 kg (153 lb)  08/03/16 62.6 kg (138 lb)  07/15/16 66.1 kg (145 lb 12.8 oz)     Intake/Output Summary (Last 24 hours)  at 12/10/16 0934 Last data filed at 12/10/16 0835  Gross per 24 hour  Intake             4095 ml  Output              450 ml  Net             3645 ml     Physical Exam  Gen: Elderly male, appears fatigued, not in distress HEENT: Pallor present, moist mucosa, supple neck Chest: clear b/l, no added sounds CVS: N S1&S2, no murmurs, rubs or gallop GI: soft, NT, ND,  Musculoskeletal: warm, left buttock ulcer with clean packing     Data Review:    CBC  Recent Labs Lab 12/07/16 1425 12/08/16 0544 12/09/16 0414 12/10/16 0520  WBC 11.4* 11.2* 7.9 7.5  HGB 9.6* 8.3* 7.7* 8.3*  HCT 29.5* 25.5* 23.8* 26.0*  PLT 358 305 278 311  MCV 90.5 89.8 90.5 90.9  MCH 29.4 29.2 29.3 29.0  MCHC 32.5 32.5 32.4 31.9  RDW 13.3 13.4 13.6 13.6  LYMPHSABS 1.2  --   --   --   MONOABS 0.6  --   --   --   EOSABS 0.0  --   --   --   BASOSABS 0.0  --   --   --     Chemistries   Recent Labs Lab 12/07/16 1425 12/08/16 0544 12/09/16 0414 12/10/16 0520  NA 133* 132* 138 139  K 3.5 3.3* 3.6 3.7  CL 97* 99* 104 108  CO2 GLUCOSE 177* 195* 86 123*  BUN 41* 38* 33* 28*  CREATININE  1.34* 1.30* 1.29* 1.14  CALCIUM 8.4* 7.8* 7.8* 7.7*  AST 40  --   --   --   ALT 45  --   --   --   ALKPHOS 125  --   --   --   BILITOT 0.2*  --   --   --    ------------------------------------------------------------------------------------------------------------------ No results for input(s): CHOL, HDL, LDLCALC, TRIG, CHOLHDL, LDLDIRECT in the last 72 hours.  Lab Results  Component Value Date   HGBA1C 7.2 (H) 12/07/2016   ------------------------------------------------------------------------------------------------------------------ No results for input(s): TSH, T4TOTAL, T3FREE, THYROIDAB in the last 72 hours.  Invalid input(s): FREET3 ------------------------------------------------------------------------------------------------------------------ No results for input(s): VITAMINB12,  FOLATE, FERRITIN, TIBC, IRON, RETICCTPCT in the last 72 hours.  Coagulation profile  Recent Labs Lab 12/07/16 1425  INR 1.18    No results for input(s): DDIMER in the last 72 hours.  Cardiac Enzymes No results for input(s): CKMB, TROPONINI, MYOGLOBIN in the last 168 hours.  Invalid input(s): CK ------------------------------------------------------------------------------------------------------------------ No results found for: BNP  Inpatient Medications  Scheduled Meds: . aspirin  81 mg Oral Daily  . carvedilol  3.125 mg Oral BID WC  . chlorhexidine  15 mL Mouth Rinse BID  . collagenase   Topical Daily  . docusate sodium  100 mg Oral BID  . enoxaparin (LOVENOX) injection  40 mg Subcutaneous Q24H  . feeding supplement (ENSURE ENLIVE)  237 mL Oral BID BM  . insulin aspart  0-5 Units Subcutaneous QHS  . insulin aspart  0-9 Units Subcutaneous TID WC  . mouth rinse  15 mL Mouth Rinse q12n4p  . potassium chloride  40 mEq Oral Once  . rosuvastatin  40 mg Oral q1800  . silver sulfADIAZINE   Topical BID   Continuous Infusions: . sodium chloride 75 mL/hr at 12/10/16 0247  . lactated ringers 50 mL/hr at 12/07/16 2204  . piperacillin-tazobactam (ZOSYN)  IV Stopped (12/10/16 0309)  . vancomycin Stopped (12/09/16 1616)   PRN Meds:.acetaminophen **OR** acetaminophen, ondansetron **OR** ondansetron (ZOFRAN) IV, polyethylene glycol  Micro Results Recent Results (from the past 240 hour(s))  Blood Culture (routine x 2)     Status: None (Preliminary result)   Collection Time: 12/07/16  3:22 PM  Result Value Ref Range Status   Specimen Description LEFT ANTECUBITAL  Final   Special Requests   Final    BOTTLES DRAWN AEROBIC AND ANAEROBIC Blood Culture adequate volume   Culture NO GROWTH 2 DAYS  Final   Report Status PENDING  Incomplete  Blood Culture (routine x 2)     Status: None (Preliminary result)   Collection Time: 12/07/16  3:28 PM  Result Value Ref Range Status   Specimen  Description RIGHT ANTECUBITAL  Final   Special Requests   Final    BOTTLES DRAWN AEROBIC AND ANAEROBIC Blood Culture adequate volume   Culture NO GROWTH 2 DAYS  Final   Report Status PENDING  Incomplete    Radiology Reports Dg Chest 2 View  Result Date: 12/07/2016 CLINICAL DATA:  Weakness. EXAM: CHEST  2 VIEW COMPARISON:  None. FINDINGS: Trachea is midline. Heart size within normal limits. Thoracic aorta is calcified. Minimal scarring at the left lung base, adjacent to an elevated left hemidiaphragm. Lungs are otherwise clear. No pleural fluid. There is flowing osteophytosis throughout the visualized spine. IMPRESSION: 1. No acute findings in the chest. 2. Flowing osteophytosis throughout the spine is indicative of ankylosing spondylitis. Electronically Signed   By: Leanna Battles M.D.   On: 12/07/2016 16:58  Time Spent in minutes  25   Eddie North M.D on 12/10/2016 at 9:34 AM  Between 7am to 7pm - Pager - [phone]  After 7pm go to [email or web address] - password [facility]  [facility] -  Office  [phone]

## 2016-12-10 NOTE — Progress Notes (Signed)
Nutrition Brief Note  RD consulted for nutritional assessment.   RD operating remotely today. However, was seen by RD on 10/11. RD had been unable to perform physical exam at the time, but pt had been suspected to meet malnutrition criteria.Interventions were placed at that time.   Per review of meals, pt has been eating 75-100% of his meals since he was seen by RD. Do not feel further  nutritional interventions warranted at this time. RD will continue to follow.   Christophe Louis RD, LDN, CNSC Clinical Nutrition Pager: [phone] 12/10/2016 2:22 PM

## 2016-12-10 NOTE — Progress Notes (Signed)
Dressing change to right heel and left gluteal fold at this time. The gluteal fold wound is about 3 cm in diameter and approximately 6-7cm deep. Santyl ointment applied and wet to dry dressing completed at this time with minimal pain. After completion of the dressing change the patient was transferred to room 314 under the care of [facility].

## 2016-12-11 DIAGNOSIS — D509 Iron deficiency anemia, unspecified: Secondary | ICD-10-CM

## 2016-12-11 LAB — BASIC METABOLIC PANEL
Anion gap: 7 (ref 5–15)
BUN: 22 mg/dL — AB (ref 6–20)
CHLORIDE: 108 mmol/L (ref 101–111)
CO2: 22 mmol/L (ref 22–32)
CREATININE: 1.04 mg/dL (ref 0.61–1.24)
Calcium: 7.7 mg/dL — ABNORMAL LOW (ref 8.9–10.3)
GFR calc Af Amer: >60 mL/min (ref >60)
GFR calc non Af Amer: >60 mL/min (ref >60)
Glucose, Bld: 95 mg/dL (ref 65–99)
Potassium: 3.4 mmol/L — ABNORMAL LOW (ref 3.5–5.1)
Sodium: 137 mmol/L (ref 135–145)

## 2016-12-11 LAB — CBC
HEMATOCRIT: 25 % — AB (ref 39.0–52.0)
HEMOGLOBIN: 8.1 g/dL — AB (ref 13.0–17.0)
MCH: 29 pg (ref 26.0–34.0)
MCHC: 32.4 g/dL (ref 30.0–36.0)
MCV: 89.6 fL (ref 78.0–100.0)
Platelets: 324 10*3/uL (ref 150–400)
RBC: 2.79 MIL/uL — ABNORMAL LOW (ref 4.22–5.81)
RDW: 13.5 % (ref 11.5–15.5)
WBC: 7.1 10*3/uL (ref 4.0–10.5)

## 2016-12-11 LAB — GLUCOSE, CAPILLARY
Glucose-Capillary: 94 mg/dL (ref 65–99)
Glucose-Capillary: 187 mg/dL — ABNORMAL HIGH (ref 65–99)
Glucose-Capillary: 156 mg/dL — ABNORMAL HIGH (ref 65–99)
Glucose-Capillary: 126 mg/dL — ABNORMAL HIGH (ref 65–99)

## 2016-12-11 MED ORDER — FERROUS SULFATE 325 (65 FE) MG PO TABS
325.0000 mg | ORAL_TABLET | Freq: Two times a day (BID) | ORAL | Status: DC
  Administered 2016-12-11 – 2016-12-12 (×2): 325 mg via ORAL
  Filled 2016-12-11 (×2): qty 1

## 2016-12-11 NOTE — Progress Notes (Signed)
[facility]  Patient doing well this AM. No complaints.  BP (!) 141/47 (BP Location: Left Arm)   Pulse 64   Temp 98.2 F (36.8 C) (Oral)   Resp 20   Ht  (1.727 m)   Wt 153 lb (69.4 kg)   SpO2 95%   BMI 23.26 kg/m  NAD Left ischial ulcer with some fibrinous material at base, no erythema or drainage, kerlix clean on removal  MAE  Continue to pack the wound twice daily with damp to dry kerlix, and place santyl to the area once daily prior to packing.  Ok to d/c to facility from surgery standpoint.    Algis Greenhouse, MD [facility] [address] [place], [place] [address] [phone] (office)

## 2016-12-11 NOTE — Progress Notes (Signed)
PROGRESS NOTE                                                                                                                                                                                                             Patient Demographics:    Jesse Lucas, is a 82 y.o. male, DOB - [birth year], GNF:[id number]  Admit date - 12/07/2016   Admitting Physician Jonah Blue, MD  Outpatient Primary MD for the patient is Joaquin Courts, DO  LOS - 4  Outpatient Specialists:   Chief Complaint  Patient presents with  . Weakness       Brief Narrative   82 year old male with coronary artery disease, peripheral artery disease presented with generalized weakness and subjective fever.Marland Kitchen He also has a buttock wound that was draining purulent material. Patient was placed on cefuroxime and by his? PCP. He had outpatient blood will done and was told that he was anemic and needed to come to the ED. Patient was found to be septic with elevated WBC, tachypnea and elevated lactic acid possibly from the left buttock wound. Admitted to stepdown unit with empiric IV antibiotic and surgery consulted.   Subjective:   Remains afebrile. Denies any pain.   Assessment  & Plan :    Principal Problem:   Sepsis (HCC) Secondary to infected left buttock pressure ulcer. Sepsis resolved. On empiric vancomycin and Zosyn. Blood cultures negative. Bedside debridement of the ulcer done with packing by surgery, consult appreciated..  I will discuss with ID in the morning and narrow antibiotic suitable oral regimen for discharge to SNF tomorrow.  Active Problems: stage IV left ischial pressure ulcer Large ulcer measuring 744 cm tunneling deep inside. Status post bedside debridement. Continue empiric antibiotics for now. Surgery recommends twice a day dressing changes with Kerlix, Santyl to the base once daily before dressing change.    CAD  (coronary artery disease)   PAD (peripheral artery disease) (HCC) Continue aspirin, beta blocker and statin.    Severe protein-calorie malnutrition (HCC) Get nutrition consult.    AKI (acute kidney injury) (HCC)  secondary to sepsis and prerenal with dehydration. Improved with IV fluids.  Anemia  Hemoccult negative. Baseline hemoglobin of 11. Iron panel suggests iron deficiency. Without supplement.  Diabetes mellitus type 2 A1c of 7.2. Not on any home medication. Monitor on sliding scale coverage.  Code Status : Full code  Family Communication  : None at bedside  Disposition Plan  : SNF possibly tomorrow.  Barriers For Discharge : Active symptoms  Consults  :  Surgery  Procedures  : Incision and debridement of the ischial ulcer  DVT Prophylaxis  : Lovenox  Lab Results  Component Value Date   PLT 324 12/11/2016    Antibiotics  :    Anti-infectives    Start     Dose/Rate Route Frequency Ordered Stop   12/10/16 1600  vancomycin (VANCOCIN) 1,250 mg in sodium chloride 0.9 % 250 mL IVPB     1,250 mg 166.7 mL/hr over 90 Minutes Intravenous Every 24 hours 12/10/16 1106     12/08/16 1600  vancomycin (VANCOCIN) IVPB 750 mg/150 ml premix  Status:  Discontinued     750 mg 150 mL/hr over 60 Minutes Intravenous Every 24 hours 12/07/16 2108 12/10/16 1106   12/08/16 0000  piperacillin-tazobactam (ZOSYN) IVPB 3.375 g     3.375 g 12.5 mL/hr over 240 Minutes Intravenous Every 8 hours 12/07/16 2108     12/07/16 1515  piperacillin-tazobactam (ZOSYN) IVPB 3.375 g     3.375 g 100 mL/hr over 30 Minutes Intravenous  Once 12/07/16 1507 12/07/16 1652   12/07/16 1515  vancomycin (VANCOCIN) IVPB 1000 mg/200 mL premix     1,000 mg 200 mL/hr over 60 Minutes Intravenous  Once 12/07/16 1507 12/07/16 1759        Objective:   Vitals:   12/10/16 1500 12/10/16 1609 12/10/16 2206 12/11/16 0648  BP: (!) 160/76 (!) 184/69 (!) 117/52 (!) 141/47  Pulse: (!) 59 (!) 56 (!) 59 64  Resp: (!)  Temp:  97.8 F (36.6 C) 98.5 F (36.9 C) 98.2 F (36.8 C)  TempSrc:  Oral Oral Oral  SpO2: 98% 100% 96% 95%  Weight:      Height:        Wt Readings from Last 3 Encounters:  12/10/16 69.4 kg (153 lb)  08/03/16 62.6 kg (138 lb)  07/15/16 66.1 kg (145 lb 12.8 oz)     Intake/Output Summary (Last 24 hours) at 12/11/16 1206 Last data filed at 12/11/16 0649  Gross per 24 hour  Intake          1113.75 ml  Output             2350 ml  Net         -1236.25 ml     Physical Exam gen: Elderly male not in distress   HEENT: Pallor present, moist mucosa, supple neck, Chest: Clear bilaterally CVS: Normal S1 and S2, no murmurs GI: Soft, nondistended, nontender, bowel sounds present Musculoskeletal: Warm, left buttock ulcer with clean packing    Data Review:    CBC  Recent Labs Lab 12/07/16 1425 12/08/16 0544 12/09/16 0414 12/10/16 0520 12/11/16 0632  WBC 11.4* 11.2* 7.9 7.5 7.1  HGB 9.6* 8.3* 7.7* 8.3* 8.1*  HCT 29.5* 25.5* 23.8* 26.0* 25.0*  PLT 358 305 278 311 324  MCV 90.5 89.8 90.5 90.9 89.6  MCH 29.4 29.2 29.3 29.0 29.0  MCHC 32.5 32.5 32.4 31.9 32.4  RDW 13.3 13.4 13.6 13.6 13.5  LYMPHSABS 1.2  --   --   --   --   MONOABS 0.6  --   --   --   --   EOSABS 0.0  --   --   --   --   BASOSABS 0.0  --   --   --   --  Chemistries   Recent Labs Lab 12/07/16 1425 12/08/16 0544 12/09/16 0414 12/10/16 0520 12/11/16 0632  NA 133* 132* 138 139 137  K 3.5 3.3* 3.6 3.7 3.4*  CL 97* 99* 104 108 108  CO2 GLUCOSE 177* 195* 86 123* 95  BUN 41* 38* 33* 28* 22*  CREATININE 1.34* 1.30* 1.29* 1.14 1.04  CALCIUM 8.4* 7.8* 7.8* 7.7* 7.7*  AST 40  --   --   --   --   ALT 45  --   --   --   --   ALKPHOS 125  --   --   --   --   BILITOT 0.2*  --   --   --   --    ------------------------------------------------------------------------------------------------------------------ No results for input(s): CHOL, HDL, LDLCALC, TRIG, CHOLHDL,  LDLDIRECT in the last 72 hours.  Lab Results  Component Value Date   HGBA1C 7.2 (H) 12/07/2016   ------------------------------------------------------------------------------------------------------------------ No results for input(s): TSH, T4TOTAL, T3FREE, THYROIDAB in the last 72 hours.  Invalid input(s): FREET3 ------------------------------------------------------------------------------------------------------------------  Recent Labs  12/10/16 1122  VITAMINB12 747  TIBC 171*  IRON 45    Coagulation profile  Recent Labs Lab 12/07/16 1425  INR 1.18    No results for input(s): DDIMER in the last 72 hours.  Cardiac Enzymes No results for input(s): CKMB, TROPONINI, MYOGLOBIN in the last 168 hours.  Invalid input(s): CK ------------------------------------------------------------------------------------------------------------------ No results found for: BNP  Inpatient Medications  Scheduled Meds: . aspirin  81 mg Oral Daily  . carvedilol  3.125 mg Oral BID WC  . chlorhexidine  15 mL Mouth Rinse BID  . collagenase   Topical Daily  . docusate sodium  100 mg Oral BID  . enoxaparin (LOVENOX) injection  40 mg Subcutaneous Q24H  . feeding supplement (ENSURE ENLIVE)  237 mL Oral BID BM  . insulin aspart  0-5 Units Subcutaneous QHS  . insulin aspart  0-9 Units Subcutaneous TID WC  . mouth rinse  15 mL Mouth Rinse q12n4p  . potassium chloride  40 mEq Oral Once  . rosuvastatin  40 mg Oral q1800  . silver sulfADIAZINE   Topical BID   Continuous Infusions: . sodium chloride 75 mL/hr at 12/10/16 1633  . piperacillin-tazobactam (ZOSYN)  IV 3.375 g (12/11/16 1116)  . vancomycin Stopped (12/10/16 1804)   PRN Meds:.acetaminophen **OR** acetaminophen, ondansetron **OR** ondansetron (ZOFRAN) IV, polyethylene glycol  Micro Results Recent Results (from the past 240 hour(s))  Blood Culture (routine x 2)     Status: None (Preliminary result)   Collection Time: 12/07/16   3:22 PM  Result Value Ref Range Status   Specimen Description LEFT ANTECUBITAL  Final   Special Requests   Final    BOTTLES DRAWN AEROBIC AND ANAEROBIC Blood Culture adequate volume   Culture NO GROWTH 3 DAYS  Final   Report Status PENDING  Incomplete  Blood Culture (routine x 2)     Status: None (Preliminary result)   Collection Time: 12/07/16  3:28 PM  Result Value Ref Range Status   Specimen Description RIGHT ANTECUBITAL  Final   Special Requests   Final    BOTTLES DRAWN AEROBIC AND ANAEROBIC Blood Culture adequate volume   Culture NO GROWTH 3 DAYS  Final   Report Status PENDING  Incomplete  MRSA PCR Screening     Status: None   Collection Time: 12/10/16 11:25 AM  Result Value Ref Range Status   MRSA by PCR NEGATIVE NEGATIVE Final  Comment:        The GeneXpert MRSA Assay (FDA approved for NASAL specimens only), is one component of a comprehensive MRSA colonization surveillance program. It is not intended to diagnose MRSA infection nor to guide or monitor treatment for MRSA infections.     Radiology Reports Dg Chest 2 View  Result Date: 12/07/2016 CLINICAL DATA:  Weakness. EXAM: CHEST  2 VIEW COMPARISON:  None. FINDINGS: Trachea is midline. Heart size within normal limits. Thoracic aorta is calcified. Minimal scarring at the left lung base, adjacent to an elevated left hemidiaphragm. Lungs are otherwise clear. No pleural fluid. There is flowing osteophytosis throughout the visualized spine. IMPRESSION: 1. No acute findings in the chest. 2. Flowing osteophytosis throughout the spine is indicative of ankylosing spondylitis. Electronically Signed   By: Leanna Battles M.D.   On: 12/07/2016 16:58    Time Spent in minutes  25   Eddie North M.D on 12/11/2016 at 12:06 PM  Between 7am to 7pm - Pager - [phone]  After 7pm go to [email or web address] - password [facility]  [facility] -  Office  [phone]

## 2016-12-12 DIAGNOSIS — L03317 Cellulitis of buttock: Secondary | ICD-10-CM

## 2016-12-12 DIAGNOSIS — A419 Sepsis, unspecified organism: Principal | ICD-10-CM

## 2016-12-12 DIAGNOSIS — N179 Acute kidney failure, unspecified: Secondary | ICD-10-CM

## 2016-12-12 DIAGNOSIS — E119 Type 2 diabetes mellitus without complications: Secondary | ICD-10-CM

## 2016-12-12 LAB — GLUCOSE, CAPILLARY
GLUCOSE-CAPILLARY: 106 mg/dL — AB (ref 65–99)
Glucose-Capillary: 169 mg/dL — ABNORMAL HIGH (ref 65–99)

## 2016-12-12 LAB — CULTURE, BLOOD (ROUTINE X 2)
CULTURE: NO GROWTH
Culture: NO GROWTH
SPECIAL REQUESTS: ADEQUATE
Special Requests: ADEQUATE

## 2016-12-12 MED ORDER — SILVER SULFADIAZINE 1 % EX CREA: TOPICAL_CREAM | Freq: Two times a day (BID) | CUTANEOUS | 0 refills | Status: DC

## 2016-12-12 MED ORDER — AMOXICILLIN-POT CLAVULANATE 875-125 MG PO TABS: 1.0000 | ORAL_TABLET | Freq: Two times a day (BID) | ORAL | 0 refills | Status: AC

## 2016-12-12 MED ORDER — DOXYCYCLINE HYCLATE 100 MG PO TABS
100.0000 mg | ORAL_TABLET | Freq: Two times a day (BID) | ORAL | Status: DC
  Administered 2016-12-12: 100 mg via ORAL
  Filled 2016-12-12: qty 1

## 2016-12-12 MED ORDER — FERROUS SULFATE 325 (65 FE) MG PO TABS: 325.0000 mg | ORAL_TABLET | Freq: Two times a day (BID) | ORAL | 3 refills | Status: AC

## 2016-12-12 MED ORDER — COLLAGENASE 250 UNIT/GM EX OINT: TOPICAL_OINTMENT | Freq: Every day | CUTANEOUS | 0 refills | Status: AC

## 2016-12-12 MED ORDER — ENSURE ENLIVE PO LIQD: 237.0000 mL | Freq: Two times a day (BID) | ORAL | 12 refills | Status: AC

## 2016-12-12 MED ORDER — SENNOSIDES-DOCUSATE SODIUM 8.6-50 MG PO TABS: 1.0000 | ORAL_TABLET | Freq: Every evening | ORAL | 0 refills | Status: AC | PRN

## 2016-12-12 MED ORDER — AMOXICILLIN-POT CLAVULANATE 875-125 MG PO TABS
1.0000 | ORAL_TABLET | Freq: Two times a day (BID) | ORAL | Status: DC
  Administered 2016-12-12: 1 via ORAL
  Filled 2016-12-12: qty 1

## 2016-12-12 MED ORDER — SACCHAROMYCES BOULARDII 250 MG PO CAPS: 250.0000 mg | ORAL_CAPSULE | Freq: Two times a day (BID) | ORAL | 0 refills | Status: AC

## 2016-12-12 MED ORDER — DOXYCYCLINE HYCLATE 100 MG PO TABS: 100.0000 mg | ORAL_TABLET | Freq: Two times a day (BID) | ORAL | 0 refills | Status: AC

## 2016-12-12 NOTE — Clinical Social Work Placement (Signed)
   CLINICAL SOCIAL WORK PLACEMENT  NOTE  Date:  12/12/2016  Patient Details  Name: Jesse Lucas MRN: [id number] Date of Birth: 16-Feb-1935  Clinical Social Work is seeking post-discharge placement for this patient at the Skilled  Nursing Facility level of care (*CSW will initial, date and re-position this form in  chart as items are completed):  Yes   Patient/family provided with [facility] Clinical Social Work Department's list of facilities offering this level of care within the geographic area requested by the patient (or if unable, by the patient's family).  Yes   Patient/family informed of their freedom to choose among providers that offer the needed level of care, that participate in Medicare, Medicaid or managed care program needed by the patient, have an available bed and are willing to accept the patient.  Yes   Patient/family informed of [facility]'s ownership interest in [facility] and [facility], as well as of the fact that they are under no obligation to receive care at these facilities.  PASRR submitted to EDS on       PASRR number received on       Existing PASRR number confirmed on       FL2 transmitted to all facilities in geographic area requested by pt/family on 12/12/16     FL2 transmitted to all facilities within larger geographic area on       Patient informed that his/her managed care company has contracts with or will negotiate with certain facilities, including the following:        Yes   Patient/family informed of bed offers received.  Patient chooses bed at [facility] and [facility]     Physician recommends and patient chooses bed at      Patient to be [facility] on 12/12/16.  Patient to be transferred to facility by Spouse and daughter     Patient family notified on 12/12/16 of transfer.  Name of family member notified:  spouse and daughter     PHYSICIAN       Additional Comment:   Morrie Sheldon at [facility] notified of patient's dicharge. Discharge clinicals sent via [employer]. LCSW signing off.   _______________________________________________ Annice Needy, LCSW 12/12/2016, 3:41 PM

## 2016-12-12 NOTE — Progress Notes (Signed)
Nutrition Follow-up  DOCUMENTATION CODES:  Pt meets criteria for moderate MALNUTRITION in the context of chronic wound as evidenced by moderate fat loss upper arms and moderate to severe muscle loss.  INTERVENTION:  Provided Handout: "foods to promote healing" reviewed with patient.  Content:(Protein sources, Vitamin A, C and Zinc containing foods).  Continue Ensure Enlive po BID, each supplement provides 350 kcal and 20 grams of protein   Recommend he discharge with Ensure BID   NUTRITION DIAGNOSIS:   Increased nutrient needs related to wound healing as evidenced by estimated needs.   GOAL:   Patient will meet greater than or equal to 90% of their needs   MONITOR:   Skin, PO intake, Supplement acceptance, Labs  REASON FOR ASSESSMENT:   Consult  (Malnutrition)  ASSESSMENT: Follow up consult to assess for malnutrition. His wife prepares most of their food at home. He has eaten well this admission 50-75% of meals able to feed himself and is drinking his Ensure.  Nutrition-Focused physical exam completed. Findings are moderate fat depletion (upper arms), moderate temporal, clavicle muscle loss & severe acromion, scapular  muscle loss upper arms muscle depletion, and moderate edema to bilateral LE and bilateral hands. A re-wt was obtained for patient and suspect initial wt of 63 kg was an error. He is now in usual range of 68 kg.  The patient says he weighted 170-175 lbs 3- 4 yrs ago and says his wound is at least that old.   Recent Labs Lab 12/09/16 0414 12/10/16 0520 12/11/16 0632  NA 138 139 137  K 3.6 3.7 3.4*  CL 104 108 108  CO2 BUN 33* 28* 22*  CREATININE 1.29* 1.14 1.04  CALCIUM 7.8* 7.7* 7.7*  GLUCOSE 86 123* 95  Labs: Albumin 2.0 on 10/10-(low)  Diet Order:  Diet heart healthy/carb modified Room service appropriate? Yes; Fluid consistency: Thin  Skin:   (Unstageable to left buttock and stage 3 to right heel.). Dry scaly skin bilateral  shins.  Last BM:  10/10  Height:   Ht Readings from Last 1 Encounters:  12/07/16  (1.727 m)    Weight:   Wt Readings from Last 1 Encounters:  12/10/16 153 lb (69.4 kg)    Ideal Body Weight:  70 kg  BMI:  Body mass index is 23.26 kg/m.  Estimated Nutritional Needs:   Kcal:  2079-2205 (33-35 kcal/kg)  Protein:  82-89 gr   Fluid:  1.9 liters daily  EDUCATION NEEDS:     Royann Shivers MS,RD,CSG,LDN Office: #[phone] Pager: [phone]

## 2016-12-12 NOTE — Care Management Important Message (Signed)
Important Message  Patient Details  Name: Jesse Lucas MRN: [id number] Date of Birth: [birth year]   Medicare Important Message Given:  Yes    Macedonio Scallon, Chrystine Oiler, RN 12/12/2016, 12:07 PM
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
## 2016-12-12 NOTE — Discharge Summary (Signed)
Physician Discharge Summary  Jesse Lucas VHQ:[id number] DOB: 08-Dec-1934 DOA: 12/07/2016  PCP: Joaquin Courts, DO  Admit date: 12/07/2016 Discharge date: 12/12/2016  Admitted From: home Disposition:  SNF  Recommendations for Outpatient Follow-up:  1. Follow up with M.D. At SNF in 1 week 2. Patient is being discharged on 2 weeks course of Augmentin and doxycycline until 12/26/2016.  Needs wound packing twice daily with damp to dry Kerlix and placed santyl to the area once daily prior to packing.   Home Health:none Equipment/Devices:as per therapy at SNF   Discharge Condition: fair CODE STATUS:full code Diet recommendation: heart healthy/ carb modified     Discharge Diagnoses:  Principal Problem:   Sepsis (HCC)  Active Problems:   AKI (acute kidney injury) (HCC)   Unstageable pressure ulcer of sacral region (HCC)   CAD (coronary artery disease)   PAD (peripheral artery disease) (HCC)   Hyperlipidemia   Severe protein-calorie malnutrition (HCC)   Tobacco dependence   Decubitus ulcer of left ischial area, stage IV (HCC)   Diabetes mellitus, controlled (HCC)  Brief narrative/history of present illness Please refer to admission H&P for details, brief, 82 year old male with coronary artery disease, peripheral artery disease presented with generalized weakness and subjective fever.Marland Kitchen He also has a buttock wound that was draining purulent material. Patient was placed on cefuroxime and by his? PCP. He had outpatient blood will done and was told that he was anemic and needed to come to the ED. Patient was found to be septic with elevated WBC, tachypnea and elevated lactic acid possibly from the left buttock wound. Admitted to stepdown unit with empiric IV antibiotic and surgery consulted.  Hospital course   Principal Problem:   Sepsis (HCC) Secondary to infected left buttock pressure ulcer. Sepsis resolved. On empiric vancomycin and Zosyn. Blood cultures negative.  Bedside debridement of the ulcer done with packing by surgery, consult appreciated..    Discussed with ID on the phone and recommend 2 weeks course of oral Augmentin and doxycycline.    Active Problems: stage IV left ischial pressure ulcer Large ulcer measuring 744 cm tunneling deep inside. Status post bedside debridement.  Evaluated by surgery on 10/14. Ulcer has some fibrinous material and base without erythema or drainage. Recommend wound packing twice daily with damp to dry Kerlix and place Santyl to the area once daily prior to packing.  Received IV vancomycin and Zosyn while in the hospital, discharged on oral Augmentin and doxycycline.   CAD (coronary artery disease)   PAD (peripheral artery disease) (HCC) Continue aspirin, beta blocker and statin.    Severe protein-calorie malnutrition (HCC) Added nutritional supplement.    AKI (acute kidney injury) (HCC)  secondary to sepsis and prerenal with dehydration. Improved with IV fluids.  Iron deficiency anemia Hemoccult negative. Baseline hemoglobin of 11. Iron panel suggests iron deficiency. Added supplement.  Diabetes mellitus type 2 A1c of 7.2. Patient unaware of diagnosis and not on any medications. Stable on sliding scale coverage.     Family Communication  : wife and daughter at bedside  Disposition Plan  : SNF   Consults  :  Surgery  Procedures  : Incision and debridement of the ischial ulcer  Discharge Instructions   Allergies as of 12/12/2016   No Known Allergies     Medication List    STOP taking these medications   cefUROXime 250 MG tablet Commonly known as:  CEFTIN     TAKE these medications   acetaminophen 500 MG tablet Commonly known as:  TYLENOL Take 500 mg by mouth daily as needed for moderate pain or headache.   amoxicillin-clavulanate 875-125 MG tablet Commonly known as:  AUGMENTIN Take 1 tablet by mouth every 12 (twelve) hours.   aspirin 81 MG chewable tablet Chew 81  mg by mouth daily.   carvedilol 3.125 MG tablet Commonly known as:  COREG TAKE 1 TABLET BY MOUTH TWICE DAILY   collagenase ointment Commonly known as:  SANTYL Apply topically daily. Apply to left buttock daily   CRESTOR 40 MG tablet Generic drug:  rosuvastatin Crestor 40 mg tablet  Take 1 tablet every day by oral route.   doxycycline 100 MG tablet Commonly known as:  VIBRA-TABS Take 1 tablet (100 mg total) by mouth every 12 (twelve) hours.   feeding supplement (ENSURE ENLIVE) Liqd Take 237 mLs by mouth 2 (two) times daily between meals.   ferrous sulfate 325 (65 FE) MG tablet Take 1 tablet (325 mg total) by mouth 2 (two) times daily with a meal.   polyethylene glycol packet Commonly known as:  MIRALAX / GLYCOLAX Take 17 g by mouth daily as needed for mild constipation.   saccharomyces boulardii 250 MG capsule Commonly known as:  FLORASTOR Take 1 capsule (250 mg total) by mouth 2 (two) times daily.   senna-docusate 8.6-50 MG tablet Commonly known as:  Senokot-S Take 1 tablet by mouth at bedtime as needed for moderate constipation.   silver sulfADIAZINE 1 % cream Commonly known as:  SSD Apply topically 2 (two) times daily. Apply over right  foot What changed:  See the new instructions.      Contact information for after-discharge care    Destination    [facility] SN[facility] Follow up.   Specialty:  Skilled Nursing Facility Contact information: [address] [place] [address] [phone]             No Known Allergies      Procedures/Studies: Dg Chest 2 View  Result Date: 12/07/2016 CLINICAL DATA:  Weakness. EXAM: CHEST  2 VIEW COMPARISON:  None. FINDINGS: Trachea is midline. Heart size within normal limits. Thoracic aorta is calcified. Minimal scarring at the left lung base, adjacent to an elevated left hemidiaphragm. Lungs are otherwise clear. No pleural fluid. There is flowing osteophytosis throughout the visualized spine.  IMPRESSION: 1. No acute findings in the chest. 2. Flowing osteophytosis throughout the spine is indicative of ankylosing spondylitis. Electronically Signed   By: Leanna Battles M.D.   On: 12/07/2016 16:58      Subjective: Denies any pain. No overnight events.  Discharge Exam: Vitals:   12/12/16 0417 12/12/16 1324  BP: 137/61 (!) 131/54  Pulse: 67 75  Resp: 18 17  Temp: 98.3 F (36.8 C) 98.8 F (37.1 C)  SpO2: 98% 97%   Vitals:   12/11/16 1500 12/11/16 2015 12/12/16 0417 12/12/16 1324  BP: (!) 127/48 (!) 131/54 137/61 (!) 131/54  Pulse: 63 70 67 75  Resp: Temp: 98.1 F (36.7 C) 98 F (36.7 C) 98.3 F (36.8 C) 98.8 F (37.1 C)  TempSrc: Oral Oral Oral Oral  SpO2: 96% 97% 98% 97%  Weight:      Height:        gen: Elderly male not in distress   HEENT: Pallor present, moist mucosa, supple neck,temporal wasting Chest: Clear bilaterally CVS: Normal S1 and S2, no murmurs GI: Soft, nondistended, nontender, bowel sounds present Musculoskeletal: Warm, left buttock ulcer with clean packing CNS: Alert and oriented  The results of significant diagnostics from this hospitalization (including imaging, microbiology, ancillary and laboratory) are listed below for reference.     Microbiology: Recent Results (from the past 240 hour(s))  Blood Culture (routine x 2)     Status: None   Collection Time: 12/07/16  3:22 PM  Result Value Ref Range Status   Specimen Description LEFT ANTECUBITAL  Final   Special Requests   Final    BOTTLES DRAWN AEROBIC AND ANAEROBIC Blood Culture adequate volume   Culture NO GROWTH 5 DAYS  Final   Report Status 12/12/2016 FINAL  Final  Blood Culture (routine x 2)     Status: None   Collection Time: 12/07/16  3:28 PM  Result Value Ref Range Status   Specimen Description RIGHT ANTECUBITAL  Final   Special Requests   Final    BOTTLES DRAWN AEROBIC AND ANAEROBIC Blood Culture adequate volume   Culture NO GROWTH 5 DAYS  Final   Report  Status 12/12/2016 FINAL  Final  MRSA PCR Screening     Status: None   Collection Time: 12/10/16 11:25 AM  Result Value Ref Range Status   MRSA by PCR NEGATIVE NEGATIVE Final    Comment:        The GeneXpert MRSA Assay (FDA approved for NASAL specimens only), is one component of a comprehensive MRSA colonization surveillance program. It is not intended to diagnose MRSA infection nor to guide or monitor treatment for MRSA infections.      Labs: BNP (last 3 results) No results for input(s): BNP in the last 8760 hours. Basic Metabolic Panel:  Recent Labs Lab 12/07/16 1425 12/08/16 0544 12/09/16 0414 12/10/16 0520 12/11/16 0632  NA 133* 132* 138 139 137  K 3.5 3.3* 3.6 3.7 3.4*  CL 97* 99* 104 108 108  CO2 GLUCOSE 177* 195* 86 123* 95  BUN 41* 38* 33* 28* 22*  CREATININE 1.34* 1.30* 1.29* 1.14 1.04  CALCIUM 8.4* 7.8* 7.8* 7.7* 7.7*   Liver Function Tests:  Recent Labs Lab 12/07/16 1425  AST 40  ALT 45  ALKPHOS 125  BILITOT 0.2*  PROT 6.7  ALBUMIN 2.0*   No results for input(s): LIPASE, AMYLASE in the last 168 hours. No results for input(s): AMMONIA in the last 168 hours. CBC:  Recent Labs Lab 12/07/16 1425 12/08/16 0544 12/09/16 0414 12/10/16 0520 12/11/16 0632  WBC 11.4* 11.2* 7.9 7.5 7.1  NEUTROABS 9.6*  --   --   --   --   HGB 9.6* 8.3* 7.7* 8.3* 8.1*  HCT 29.5* 25.5* 23.8* 26.0* 25.0*  MCV 90.5 89.8 90.5 90.9 89.6  PLT 358 305 278 311 324   Cardiac Enzymes: No results for input(s): CKTOTAL, CKMB, CKMBINDEX, TROPONINI in the last 168 hours. BNP: Invalid input(s): POCBNP CBG:  Recent Labs Lab 12/11/16 1135 12/11/16 1618 12/11/16 2018 12/12/16 0757 12/12/16 1117  GLUCAP 187* 156* 126* 106* 169*   D-Dimer No results for input(s): DDIMER in the last 72 hours. Hgb A1c No results for input(s): HGBA1C in the last 72 hours. Lipid Profile No results for input(s): CHOL, HDL, LDLCALC, TRIG, CHOLHDL, LDLDIRECT in the last 72  hours. Thyroid function studies No results for input(s): TSH, T4TOTAL, T3FREE, THYROIDAB in the last 72 hours.  Invalid input(s): FREET3 Anemia work up  Recent Labs  12/10/16 1122  VITAMINB12 747  TIBC 171*  IRON 45   Urinalysis No results found for: COLORURINE, APPEARANCEUR, LABSPEC, PHURINE, GLUCOSEU, HGBUR, BILIRUBINUR, KETONESUR,  PROTEINUR, UROBILINOGEN, NITRITE, LEUKOCYTESUR Sepsis Labs Invalid input(s): PROCALCITONIN,  WBC,  LACTICIDVEN Microbiology Recent Results (from the past 240 hour(s))  Blood Culture (routine x 2)     Status: None   Collection Time: 12/07/16  3:22 PM  Result Value Ref Range Status   Specimen Description LEFT ANTECUBITAL  Final   Special Requests   Final    BOTTLES DRAWN AEROBIC AND ANAEROBIC Blood Culture adequate volume   Culture NO GROWTH 5 DAYS  Final   Report Status 12/12/2016 FINAL  Final  Blood Culture (routine x 2)     Status: None   Collection Time: 12/07/16  3:28 PM  Result Value Ref Range Status   Specimen Description RIGHT ANTECUBITAL  Final   Special Requests   Final    BOTTLES DRAWN AEROBIC AND ANAEROBIC Blood Culture adequate volume   Culture NO GROWTH 5 DAYS  Final   Report Status 12/12/2016 FINAL  Final  MRSA PCR Screening     Status: None   Collection Time: 12/10/16 11:25 AM  Result Value Ref Range Status   MRSA by PCR NEGATIVE NEGATIVE Final    Comment:        The GeneXpert MRSA Assay (FDA approved for NASAL specimens only), is one component of a comprehensive MRSA colonization surveillance program. It is not intended to diagnose MRSA infection nor to guide or monitor treatment for MRSA infections.      Time coordinating discharge: Over 30 minutes  SIGNED:   Eddie North, MD  [facility] 12/12/2016, 2:59 PM Pager   If 7PM-7AM, please contact night-coverage [email or web address] Password [email or web address]

## 2016-12-12 NOTE — Progress Notes (Signed)
Discharge packet given to wife and instructed to give to facility, verbalized understanding, out in stable condition via w/c with staff.

## 2016-12-29 ENCOUNTER — Encounter: Payer: Self-pay | Admitting: General Surgery

## 2016-12-29 ENCOUNTER — Ambulatory Visit (INDEPENDENT_AMBULATORY_CARE_PROVIDER_SITE_OTHER): Payer: Medicare Other | Admitting: General Surgery

## 2016-12-29 VITALS — BP 144/57 | HR 75 | Temp 99.3°F | Resp 18 | Ht 68.0 in | Wt 122.0 lb

## 2016-12-29 DIAGNOSIS — L89324 Pressure ulcer of left buttock, stage 4: Secondary | ICD-10-CM | POA: Diagnosis not present

## 2016-12-29 NOTE — Progress Notes (Signed)
[facility] Note   HPI:  82 y.o. Male presents to clinic for follow-up evaluation of stage IV left ischial pressure ulcer with tunneling cephalad. He underwent a bedside debridement at the hospital. The patient has been at the SNF and doing therapies. He is doing well and doing therapies twice daily. He is in good spirits, and reports that the SNF is packing his wound daily.  The wife is concerned they may not be packing it deep enough.    Review of Systems:  No fevers or chills No drainage from wound  All other review of systems: otherwise negative   Vital Signs:  BP (!) 144/57   Pulse 75   Temp 99.3 F (37.4 C)   Resp 18   Ht 5\' 8"  (1.727 m)   Wt 122 lb (55.3 kg)   BMI 18.55 kg/m    Physical Exam:  Physical Exam  Constitutional: He is well-developed, well-nourished, and in no distress.  Cardiovascular: Normal rate.   Pulmonary/Chest: Effort normal.  Musculoskeletal: He exhibits no edema.  Left ischial pressure wound measuring 1.5-2 in circular skin defect with tunneling cephalad 3 in, total measurement 6X2.5X2 inches, beefy red tissue deep, ischium at base, no drainage, minimal fibrinous slough   Neurological: He is alert.  Skin: Skin is warm and dry.  Vitals reviewed.   Laboratory studies: None   Imaging:  None   Assessment:  82 y.o. yo Male with left ischial pressure ulcer stage IV with ischium at the base.  Overall doing better. Wound is slowly healing. Will need continued wound care.  Will need continued off loading.  Discussed with family this is a long process, and will be a chronic wound.   Plan:  - Wound Recommendations: 1.) Negative Pressure Wound Vacuum Therapy  -Apply black sponge to wound (tunneling cephald 3 inches, measuring 6X2.5X2inches total), apply duoderm or skin protectant around skin edges, place negative pressure to 125 mm Hg and change Monday, Wednesday and Friday OR If Wound Vacuum Not Available 2.) Santyl to wound  daily -Twice daily packing with damp to dry Kerlix packing, pack deep in the tunneled area cephald (wound measuring 6X2.5X2inches), replace if soiled as needed  - Protein supplementation with Ensure, etc  - Off Loading of pressure points and frequent turns  - Follow up 2 weeks   All of the above recommendations were discussed with the patient and patient's family, and all of patient's and family's questions were answered to their expressed satisfaction.  Algis GreenhouseLindsay Bridges, MD [facility] [address] [place], [place] [phone] (office)

## 2016-12-29 NOTE — Patient Instructions (Addendum)
Recommendations: 1.) Negative Pressure Wound Vacuum Therapy  -Apply black sponge to wound (tunneling cephalad 3 inches, measuring 6X2.5X2inches total), apply duoderm or skin protectant around skin edges, place negative pressure to 125 mm Hg and change Monday, Wednesday and Friday OR If Wound Vacuum Not Available 2.) Santyl to wound daily -Twice daily packing with damp to dry Kerlix packing, pack deep in the tunneled area cephalad (wound measuring 6X2.5X2inches), replace if soiled as needed  - Protein supplementation with Ensure, etc  - Off Loading of pressure points and frequent turns  - Follow up 2 weeks (bring supplies for wound vacuum change to the office visit)    Pressure Injury A pressure injury, sometimes called a bedsore, is an injury to the skin and underlying tissue caused by pressure. Pressure on blood vessels causes decreased blood flow to the skin, which can eventually cause the skin tissue to die and break down into a wound. Pressure injuries usually occur:  Over bony parts of the body such as the tailbone, shoulders, elbows, hips, and heels.  Under medical devices such as respiratory equipment, stockings, tubes, and splints.  Pressure injuries start as reddened areas on the skin and can lead to pain, muscle damage, and infection. Pressure injuries can vary in severity. What are the causes? Pressure injuries are caused by a lack of blood supply to an area of skin. They can occur from intense pressure over a short period of time or from less intense pressure over a long period of time. What increases the risk? This condition is more likely to develop in people who:  Are in the hospital or an extended care facility.  Are bedridden or in a wheelchair.  Have an injury or disease that keeps them from: ? Moving normally. ? Feeling pain or pressure.  Have a condition that: ? Makes them sleepy or less alert. ? Causes poor blood flow.  Need to wear a medical device.  Have  poor control of their bladder or bowel functions (incontinence).  Have poor nutrition (malnutrition).  Are of certain ethnicities. People of African American and Latino or Hispanic descent are at higher risk compared to other ethnic groups.  If you are at risk for pressure ulcers, your health care provider may recommend certain types of bedding to help prevent them. These may include foam or gel mattresses covered with one of the following:  A sheepskin blanket.  A pad that is filled with gel, air, water, or foam.  What are the signs or symptoms? The main symptom is a blister or change in skin color that opens into a wound. Other symptoms include:  Red or dark areas of skin that do not turn white or pale when pressed with a finger.  Pain, warmth, or change of skin texture.  How is this diagnosed? This condition is diagnosed with a medical history and physical exam. You may also have tests, including:  Blood tests to check for infection or signs of poor nutrition.  Imaging studies to check for damage to the deep tissues under your skin.  Blood flow studies.  Your pressure injury will be staged to determine its severity. Staging is an assessment of:  The depth of the pressure injury.  Which tissues are exposed because of the pressure injury.  The causes of the pressure injury.  How is this treated? The main focus of treatment is to help your injury heal. This may be done by:  Relieving or redistributing pressure on your skin. This includes: ?  Frequently changing your position. ? Eliminating or minimizing positions that caused the wound or that can make the wound worse. ? Using specific bed mattresses and chair cushions. ? Refitting, resizing, or replacing any medical devices, or padding the skin under them. ? Using creams or powders to prevent rubbing (friction) on the skin.  Keeping your skin clean and dry. This may include using a skin cleanser or skin protectant as told  by your health care provider. This may be a lotion, ointment, or spray.  Cleaning your injury and removing any dead tissue from the wound (debridement).  Placing a bandage (dressing) over your injury.  Preventing or treating infection. This may include antibiotic, antimicrobial, or antiseptic medicines.  Treatment may also include medicine for pain. Sometimes surgery is needed to close the wound with a flap of healthy skin or a piece of skin from another area of your body (graft). You may need surgery if other treatments are not working or if your injury is very deep. Follow these instructions at home: Wound care  Follow instructions from your health care provider about: ? How to take care of your wound. ? When and how you should change your dressing. ? When you should remove your dressing. If your dressing is dry and stuck when you try to remove it, moisten or wet the dressing with saline or water so that it can be removed without harming your skin or wound tissue.  Check your wound every day for signs of infection. Have a caregiver do this for you if you are not able. Watch for: ? More redness, swelling, or pain. ? More fluid, blood, or pus. ? A bad smell. Skin Care  Keep your skin clean and dry. Gently pat your skin dry.  Do not rub or massage your skin.  Use a skin protectant only as told by your health care provider.  Check your skin every day for any changes in color or any new blisters or sores (ulcers). Have a caregiver do this for you if you are not able. Medicines  Take over-the-counter and prescription medicines only as told by your health care provider.  If you were prescribed an antibiotic medicine, take it or apply it as told by your health care provider. Do not stop taking or using the antibiotic even if your condition improves. Reducing and Redistributing Pressure  Do not lie or sit in one position for a long time. Move or change position every two hours or as  told by your health care provider.  Use pillows or cushions to reduce pressure. Ask your health care provider to recommend cushions or pads for you.  Use medical devices that do not rub your skin. Tell your health care provider if one of your medical devices is causing a pressure injury to develop. General instructions   Eat a healthy diet that includes lots of protein. Ask your health care provider for diet advice.  Drink enough fluid to keep your urine clear or pale yellow.  Be as active as you can every day. Ask your health care provider to suggest safe exercises or activities.  Do not abuse drugs or alcohol.  Keep all follow-up visits as told by your health care provider. This is important.  Do not smoke. Contact a health care provider if:   You have chills or fever.  Your pain medicine is not helping.  You have any changes in skin color.  You have new blisters or sores.  You develop warmth, redness,  or swelling near a pressure injury.  You have a bad odor or pus coming from your pressure injury.  You lose control of your bowels or bladder.  You develop new symptoms.  Your wound does not improve after 1-2 weeks of treatment.  You develop a new medical condition, such as diabetes, peripheral vascular disease, or conditions that affect your defense (immune) system. This information is not intended to replace advice given to you by your health care provider. Make sure you discuss any questions you have with your health care provider. Document Released: 02/14/2005 Document Revised: 07/20/2015 Document Reviewed: 06/25/2014 Elsevier Interactive Patient Education  [employer].

## 2017-01-05 ENCOUNTER — Ambulatory Visit: Payer: Medicare Other | Admitting: General Surgery

## 2017-01-12 ENCOUNTER — Ambulatory Visit: Payer: Medicare Other | Admitting: General Surgery

## 2017-01-17 ENCOUNTER — Ambulatory Visit (INDEPENDENT_AMBULATORY_CARE_PROVIDER_SITE_OTHER): Payer: Medicare Other | Admitting: General Surgery

## 2017-01-17 VITALS — BP 132/71 | HR 68 | Temp 97.8°F | Resp 18 | Ht 68.0 in | Wt 138.0 lb

## 2017-01-17 DIAGNOSIS — L89324 Pressure ulcer of left buttock, stage 4: Secondary | ICD-10-CM | POA: Diagnosis not present

## 2017-01-18 ENCOUNTER — Encounter: Payer: Self-pay | Admitting: General Surgery

## 2017-01-18 NOTE — Progress Notes (Signed)
[facility] Note   HPI:  82 y.o. Male presents to clinic for wound check for his left ischial pressure wound.  A wound vacuum was placed since the last visit, and the area is healing well.  There is good granulation tissue at the base. The family reports that the patient will remain at the facility for another few weeks for therapy due to his deconditioning.   Review of Systems:  No fevers or chills Incontinence of bowel and urine daily All other review of systems: otherwise negative   Vital Signs:  BP 132/71   Pulse 68   Temp 97.8 F (36.6 C)   Resp 18   Ht 5\' 8"  (1.727 m)   Wt 138 lb (62.6 kg)   BMI 20.98 kg/m    Physical Exam:  Physical Exam  Constitutional: He appears malnourished. He appears cachectic. No distress.  Cardiovascular: Normal rate.  Pulmonary/Chest: Effort normal.  Abdominal: Soft.  Genitourinary:  Genitourinary Comments: Incontinent and diaper wet, changed by family  Musculoskeletal:  Left ischial wound measuring about 4.5X2X2cm with granulation at base, no drainage  Neurological: He is alert.  Skin: Skin is warm and dry.  Vitals reviewed.     Laboratory studies:None   Imaging:  None    Assessment:  82 y.o. yo Male with a left ischial pressure ulcer Stage IV that is undergoing negative pressure therapy with some success. He is still very debilitated and frail.   Plan:  - Supplements for nutrition/ ensure, etc   - Continue negative pressure wound vacuum to left ischial wound at 125 mm Hg continuous suction with black sponge to the area every M, W, F and as needed if soiled   Future Appointments  Date Time Provider Department Center  02/01/2017  1:00 PM Antoine PocheBranch, Jonathan F, MD [facility]  02/14/2017 11:30 AM Lucretia RoersBridges, Burleigh Brockmann C, MD [facility] None   All of the above recommendations were discussed with the patient and patient's family. The paper work of the SNF was filled out and I will fax this note, [facility], fax [phone].   Algis GreenhouseLindsay Riven Beebe, MD [facility] [address] [place], [place] [address] [phone] (office)

## 2017-01-30 ENCOUNTER — Telehealth: Payer: Self-pay | Admitting: General Surgery

## 2017-01-30 DIAGNOSIS — L89324 Pressure ulcer of left buttock, stage 4: Secondary | ICD-10-CM

## 2017-01-30 NOTE — Telephone Encounter (Signed)
Left ischial pressure wound. Black sponge to area, protect skin with barrier, 125 mm Hg continuous suction. Change every MWF.   Went to OSH with UTI with ESBL E coli and did not come back with the wound vacuum.  G[facility]. Faxed to [phone].   Algis GreenhouseLindsay Lorisa Scheid, MD [facility] [address] [place], [place] [address] [phone] (office)

## 2017-02-01 ENCOUNTER — Ambulatory Visit: Payer: Medicare Other | Admitting: Cardiology

## 2017-02-14 ENCOUNTER — Encounter: Payer: Self-pay | Admitting: General Surgery

## 2017-02-14 ENCOUNTER — Ambulatory Visit (INDEPENDENT_AMBULATORY_CARE_PROVIDER_SITE_OTHER): Payer: Medicare Other | Admitting: General Surgery

## 2017-02-14 VITALS — BP 152/69 | HR 68 | Temp 98.7°F | Resp 18 | Ht 68.0 in | Wt 157.0 lb

## 2017-02-14 DIAGNOSIS — L89324 Pressure ulcer of left buttock, stage 4: Secondary | ICD-10-CM

## 2017-02-14 NOTE — Patient Instructions (Signed)
Twice daily packing of the left ischial pressure wound with dampened kerlix gauze. When removing do not rewet the gauze, but remove it dry.  Pack the kerlix into the wound at a depth of at least 3.5 to 4cm into the buttock, use a qtip applicator to assess the tunnel prior to packing and aid in complete packing of the wound.   Supplies: Q tip applicators Kerlix gauze (cut to length once packed) Dry gauze/ ABD pad over area Paper Tape to Secure

## 2017-02-14 NOTE — Progress Notes (Signed)
[facility]   HPI:  82 y.o. Male presents to clinic for a wound check of the left ischial pressure wound. He is doing fair but was hospitalized since our last visit with a UTI with EBSL E coli and has a foley in place now. He has been doing the negative pressure wound vacuum changes at the facility but cannot tell me the exact timing of the changes. There was a period of time after his hospitalization where nothing was being done to the wound per his daughters report.   Review of Systems:  No fevers or chills recently, but was hospitalized with fever/ UTI 11/22  All other review of systems: otherwise negative   Vital Signs:  BP (!) 152/69   Pulse 68   Temp 98.7 F (37.1 C)   Resp 18   Ht 5\' 8"  (1.727 m)   Wt 157 lb (71.2 kg)   BMI 23.87 kg/m    Physical Exam:  Physical Exam  Constitutional: He is well-developed, well-nourished, and in no distress.  HENT:  Head: Normocephalic.  Cardiovascular: Normal rate.  Pulmonary/Chest: Effort normal.  Genitourinary:  Genitourinary Comments: Left ischial wound with granulation tissue, beefy red, no drainage, skin edges intact, tracks 3.5cm X 2cm X 2cm, tunneling up into the buttock area   Vitals reviewed.  Laboratory studies: None  Imaging:  None  Assessment:  82 y.o. yo Male with a slow healing left ischial pressure wound. The wound vacuum is not staying in place and staying on suction and he is not receiving the full benefits from this due to the positioning and location of the wound vacuum. The area is looking healthy but healing slowly.   Plan:  Twice daily packing of the left ischial pressure wound with dampened kerlix gauze. When removing do not rewet the gauze, but remove it dry.  Pack the kerlix into the wound at a depth of at least 3.5 to 4cm into the buttock, use a qtip applicator to assess the tunnel prior to packing and aid in complete packing of the wound.   Supplies: Q tip applicators Kerlix gauze  (cut to length once packed) Dry gauze/ ABD pad over area Paper Tape to Secure   Nutrition supplementation, weight is up   Follow up in 1 month.   Algis GreenhouseLindsay Bridges, MD [facility] [address] [place], [place] [address] [phone] (office)

## 2017-03-14 ENCOUNTER — Ambulatory Visit (INDEPENDENT_AMBULATORY_CARE_PROVIDER_SITE_OTHER): Payer: Medicare Other | Admitting: General Surgery

## 2017-03-14 ENCOUNTER — Encounter: Payer: Self-pay | Admitting: General Surgery

## 2017-03-14 VITALS — BP 127/64 | HR 64 | Temp 98.0°F | Resp 18 | Ht 68.0 in | Wt 148.0 lb

## 2017-03-14 DIAGNOSIS — L89324 Pressure ulcer of left buttock, stage 4: Secondary | ICD-10-CM

## 2017-03-14 NOTE — Progress Notes (Signed)
[facility]   HPI:  83 y.o. Male presents to clinic for a wound check of the left ischial pressure wound. The wound is being packed, but not always being packed at the SNF. Orders are for twice daily packing but sometimes the RNs place a gauze over the wound. The patient is overall feeling stronger, and is due to for discharge home 1/28.  He is now on lexapro and this is helping his spirits.   Review of Systems:  No fevers or chills  Working with therapies All other review of systems: otherwise negative   Vital Signs:  There were no vitals taken for this visit.   Physical Exam:  Physical Exam  Constitutional: He is well-developed, well-nourished, and in no distress.  Cardiovascular: Normal rate.  Musculoskeletal:  Left ischial wound with granulation tissue, beefy red, no drainage, skin edges intact, tracks 3.5cm X 2cm X 2cm, tunneling up into the buttock  Neurological: He is alert.  Skin: Skin is warm and dry.  Vitals reviewed.   Laboratory studies: None   Imaging:  None   Assessment:  83 y.o. yo Male with a chronic left ischial pressure wound who is been at a SNF that intermittently packs the wound appropriately. We tried a wound vacuum for a time, but this also was not taken care of greatly by the SNF per the family. The patient has a chronic wound, and it is slowly healing but not infected. Overall doing ok.  Plan:  Twice daily packing of the left ischial pressure wound with dampened kerlix gauze. When removing do not rewet the gauze, but remove it dry.  Pack the kerlix into the wound at a depth of at least 3.5 to 4cm into the buttock, use a qtip applicator to assess the tunnel prior to packing and aid in complete packing of the wound.   Discussed with the family that this is a chronic wound and he may have this wound the rest of his life, and if it is able to heal, it could take 6+ months more. Also discussed option of wound center referral/ hyperbaric  oxygen treatments. They are unsure if they will be able to accomplish this due to the physical debilitation of the patient and need for help with transport.   All of the above recommendations were discussed with the patient and patient's family, and all of patient's and family's questions were answered to their expressed satisfaction.  They will call if want a referral to a wound center. Will see in about 6 weeks.   Algis GreenhouseLindsay Bridges, MD [facility] [address] [place], [place] [address] [phone] (office)

## 2017-03-14 NOTE — Patient Instructions (Signed)
Twice daily packing of the left ischial pressure wound with dampened kerlix gauze. When removing do not rewet the gauze, but remove it dry.  Pack the kerlix into the wound at a depth of at least 3.5 to 4cm into the buttock, use a qtip applicator to assess the tunnel prior to packing and aid in complete packing of the wound.

## 2017-03-17 ENCOUNTER — Ambulatory Visit: Payer: Medicare Other | Admitting: Cardiology

## 2017-04-11 ENCOUNTER — Telehealth: Payer: Self-pay | Admitting: General Surgery

## 2017-04-11 NOTE — Telephone Encounter (Signed)
Jacki ConesLaurie the wound care RN.   Patient is home and getting wound care with his family and the wound RN. The wound is measuring 5-8cm deep.   Patient is not walking and is doing minimally with PT.  He is eating ok but the home health had wanted to added some protein.  Discussed air mattress. Discussed KCI -125 mm Hg black sponge to the left ischial wound. Change MWF.   Very unlikely at this point for wound to heal. Patient is not getting up and not ambulating. Patient is not a candidate for flap. He will likely continue to get more wounds in the future.  At this point the best option is to keep the wound clean. Could possibly refer over to hyperbaric oxygen therapy, but unsure if he would qualify or if this would help.  Keep appointment 2/26.  Algis GreenhouseLindsay Roberto Hlavaty, MD [facility] [address] [place], [place] [phone] (office)

## 2017-04-25 ENCOUNTER — Ambulatory Visit: Payer: Medicare Other | Admitting: Cardiology

## 2017-04-25 ENCOUNTER — Ambulatory Visit: Payer: Medicare Other | Admitting: General Surgery

## 2017-04-27 ENCOUNTER — Ambulatory Visit: Payer: Medicare Other | Admitting: General Surgery

## 2017-04-27 ENCOUNTER — Telehealth: Payer: Self-pay | Admitting: General Surgery

## 2017-04-27 NOTE — Telephone Encounter (Signed)
[facility]  [place]TI diagnosed. Antibiotics. Catheter in place with bloody urine per their report.  Is right ischial wound. Wife says there is white stuff in the wound. Dark red tissue otherwise. On the outside dry.   Wound care at the hospital. They also have hyperbaric chamber.  The wound team has been seeing him and dressing the wound.  Plan to see him when he gets out of the hospital.  Algis GreenhouseLindsay Bridges, MD [facility] [address] [place], [place] [phone] [phone] (office)

## 2017-06-06 ENCOUNTER — Ambulatory Visit: Payer: Medicare Other | Admitting: Cardiology

## 2017-06-06 DIAGNOSIS — R0989 Other specified symptoms and signs involving the circulatory and respiratory systems: Secondary | ICD-10-CM

## 2017-06-06 NOTE — Progress Notes (Deleted)
Clinical Summary Jesse Lucas is a 83 y.o.male seen today for follow up of the following medical problems.    1. PAD - followed by vascular - history of nonhealing right foot wound - being considered for lower extremity bypass vs cath intervention   3. CAD - lexiscan 05/2016 large lateral defect with moderate ischemia - 06/2016 cath: diffuse LAD disease 50-60%, OM2 100%, RCA tandem 80% lesions, acute marginal 90% - ischemia from stress thought to be related to the occluded LCX/OM, not amenable to revasc though does have some collaterals. RCA poor target.    4. AKI - elevated Cr at time of recent cath - has not had repeat labs     Past Medical History:  Diagnosis Date  . CAD (coronary artery disease)   . Hyperlipidemia   . PAD (peripheral artery disease) (HCC)      No Known Allergies   Current Outpatient Medications  Medication Sig Dispense Refill  . acetaminophen (TYLENOL) 500 MG tablet Take 500 mg by mouth daily as needed for moderate pain or headache.    Marland Kitchen aspirin 81 MG chewable tablet Chew 81 mg by mouth daily.     . carvedilol (COREG) 3.125 MG tablet TAKE 1 TABLET BY MOUTH TWICE DAILY 180 tablet 1  . collagenase (SANTYL) ointment Apply topically daily. Apply to left buttock daily 15 g 0  . feeding supplement, ENSURE ENLIVE, (ENSURE ENLIVE) LIQD Take 237 mLs by mouth 2 (two) times daily between meals. 237 mL 12  . ferrous sulfate 325 (65 FE) MG tablet Take 1 tablet (325 mg total) by mouth 2 (two) times daily with a meal. 60 tablet 3  . polyethylene glycol (MIRALAX / GLYCOLAX) packet Take 17 g by mouth daily as needed for mild constipation.    . rosuvastatin (CRESTOR) 40 MG tablet Crestor 40 mg tablet  Take 1 tablet every day by oral route.    . saccharomyces boulardii (FLORASTOR) 250 MG capsule Take 1 capsule (250 mg total) by mouth 2 (two) times daily. 30 capsule 0  . senna-docusate (SENOKOT-S) 8.6-50 MG tablet Take 1 tablet by mouth at bedtime as needed  for moderate constipation. 10 tablet 0   No current facility-administered medications for this visit.      Past Surgical History:  Procedure Laterality Date  . ABDOMINAL AORTOGRAM W/LOWER EXTREMITY N/A 06/03/2016   Procedure: Abdominal Aortogram w/Lower Extremity;  Surgeon: Sherren Kerns, MD;  Location: [facility] INVASIVE CV LAB;  Service: Cardiovascular;  Laterality: N/A;  . LEFT HEART CATH AND CORONARY ANGIOGRAPHY N/A 07/01/2016   Procedure: Left Heart Cath and Coronary Angiography;  Surgeon: Marykay Lex, MD;  Location: [facility] INVASIVE CV LAB;  Service: Cardiovascular;  Laterality: N/A;     No Known Allergies    Family History  Problem Relation Age of Onset  . Diabetes Mother   . Cancer Mother        lymph node  . Heart disease Father   . Aneurysm Father   . Heart attack Brother      Social History Jesse Lucas reports that he has been smoking pipe.  He has smoked for the past 72.00 years. He has quit using smokeless tobacco. Jesse Lucas reports that he does not drink alcohol.   Review of Systems CONSTITUTIONAL: No weight loss, fever, chills, weakness or fatigue.  HEENT: Eyes: No visual loss, blurred vision, double vision or yellow sclerae.No hearing loss, sneezing, congestion, runny nose or sore throat.  SKIN: No rash or itching.  CARDIOVASCULAR:  RESPIRATORY: No shortness of breath, cough or sputum.  GASTROINTESTINAL: No anorexia, nausea, vomiting or diarrhea. No abdominal pain or blood.  GENITOURINARY: No burning on urination, no polyuria NEUROLOGICAL: No headache, dizziness, syncope, paralysis, ataxia, numbness or tingling in the extremities. No change in bowel or bladder control.  MUSCULOSKELETAL: No muscle, back pain, joint pain or stiffness.  LYMPHATICS: No enlarged nodes. No history of splenectomy.  PSYCHIATRIC: No history of depression or anxiety.  ENDOCRINOLOGIC: No reports of sweating, cold or heat intolerance. No polyuria or polydipsia.  Marland Kitchen.   Physical  Examination There were no vitals filed for this visit. There were no vitals filed for this visit.  Gen: resting comfortably, no acute distress HEENT: no scleral icterus, pupils equal round and reactive, no palptable cervical adenopathy,  CV Resp: Clear to auscultation bilaterally GI: abdomen is soft, non-tender, non-distended, normal bowel sounds, no hepatosplenomegaly MSK: extremities are warm, no edema.  Skin: warm, no rash Neuro:  no focal deficits Psych: appropriate affect   Diagnostic Studies 05/2015 echo FINDINGS:  LEFT VENTRICLE The left ventricular size is normal. There is normal left ventricular wall  thickness. Upper septal hypertrophy (sigmoid septum), normal variant.  Turbulence in the LVOT noted but no significant gradient. Left ventricular  systolic function is normal. LV ejection fraction = 55-60%. Left ventricular  filling pattern is impaired. No segmental wall motion abnormalities seen in  the left ventricle. -  RIGHT VENTRICLE The right ventricle is normal in size and function.  LEFT ATRIUM The left atrial size is normal.  RIGHT ATRIUM  Right atrial size is normal. - AORTIC VALVE The aortic valve is trileaflet. There is aortic valve sclerosis. There is no  aortic regurgitation. - MITRAL VALVE The mitral valve leaflets appear normal. There is trace mitral regurgitation. - TRICUSPID VALVE Structurally normal tricuspid valve. There is trace tricuspid regurgitation.  There was insufficient TR detected to calculate RV systolic pressure.  Estimated right atrial pressure is 5 mmHg.. - PULMONIC VALVE The pulmonic valve is not well visualized. Trace pulmonic valvular  regurgitation. - ARTERIES The aortic root is normal size. - VENOUS Pulmonary venous flow pattern is normal. The IVC is normal in size with an  inspiratory collapse of greater then 50%, suggesting normal right atrial  pressure. - EFFUSION There is no pericardial  effusion.    05/2016 Lexiscan  No diagnostic ST segment changes to indicate ischemia. Rare PACs and PVCs noted.  Large, overall severe intensity, lateral wall defect from apex to base that exhibits partial reversibility particularly in the anterolateral distribution throughout and the inferolateral distribution at the apex. The mid to basal inferolateral wall is largely fixed. This is most consistent with scar with moderate peri-infarct ischemia.  This is a high risk study.  Nuclear stress EF: 47%.     Assessment and Plan  1. CAD - cath findings as reported above. Difficult anatomy for intervention, in absence of significant symptoms would not pursue. His anatomy nor his stress test qualify as high risk  - there is no indication nor data to support improved outcomes for coronary intervention prior to upcoming surgery - if a catheter based intervention is plausible for his PAD would consider. If not, he is at increased risk but not prohibitive risk for vascular surgery. Will message vascular surgery.   2. HTN - elevated in clinic. Will start coreg 3.125mg  bid in setting of CAD  3. AKI - repeat BMET       Antoine PocheJonathan F. Laveta Gilkey, M.D., F.A.C.C.

## 2017-06-07 ENCOUNTER — Encounter: Payer: Self-pay | Admitting: Cardiology

## 2017-06-28 DEATH — deceased

## 2017-07-14 ENCOUNTER — Telehealth: Payer: Self-pay | Admitting: Cardiology

## 2017-07-14 NOTE — Telephone Encounter (Signed)
Message to charge correction to have the no show fee adjusted.

## 2017-07-14 NOTE — Telephone Encounter (Signed)
Per wife husband passed away 07-23-17.  Wife received $50 no show.   Her husband, at that time of appt, was in Hospice care at [facility].

## 2017-09-08 IMAGING — US US CAROTID DUPLEX BILAT
1 series · 13 of 24 positions shown · non-contrast
Comparison: No recent prior.

CLINICAL DATA: Carotid bruit.

EXAM:
BILATERAL CAROTID DUPLEX ULTRASOUND
TECHNIQUE: Gray scale imaging, color Doppler and duplex ultrasound were
performed of bilateral carotid and vertebral arteries in the neck.

[Series 1: us carotid duplex bilat · 0.05mm/px · 13 of 104 slices shown]
[im 1/104]
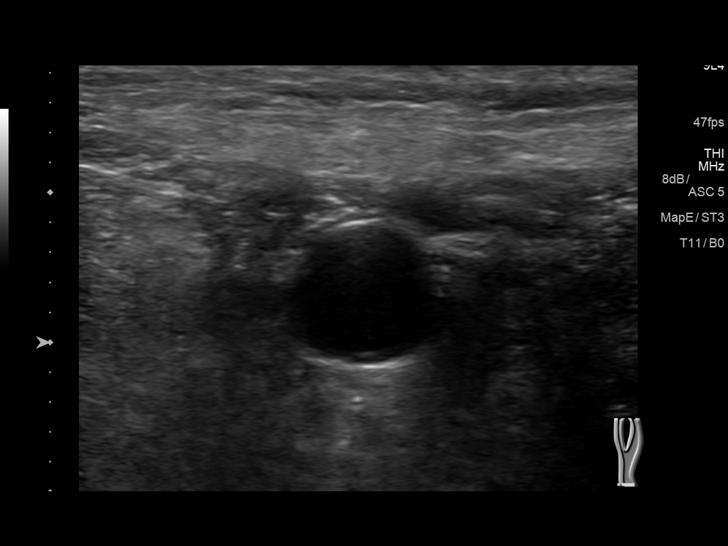
[im 9/104]
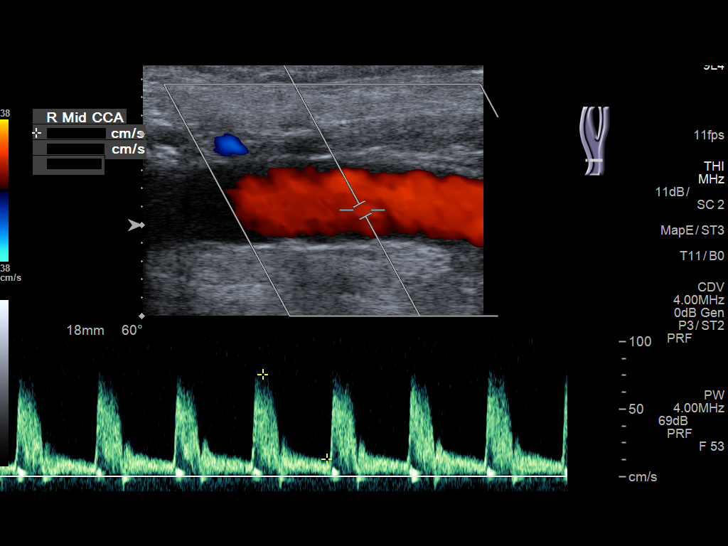
[im 18/104]
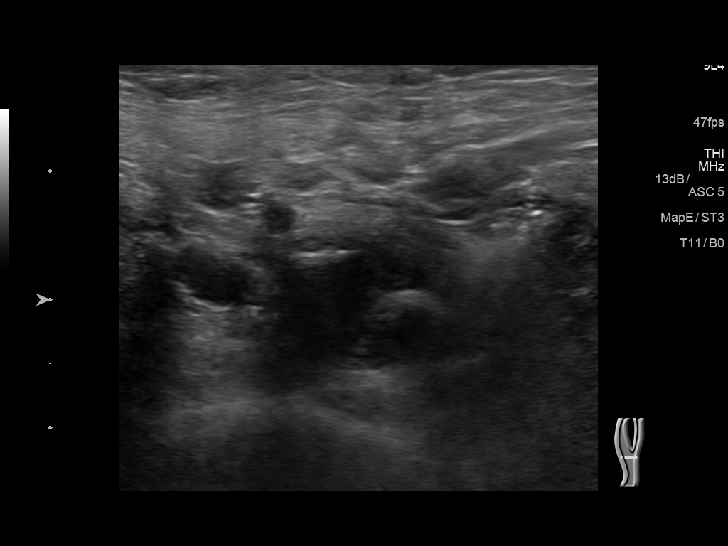
[im 27/104]
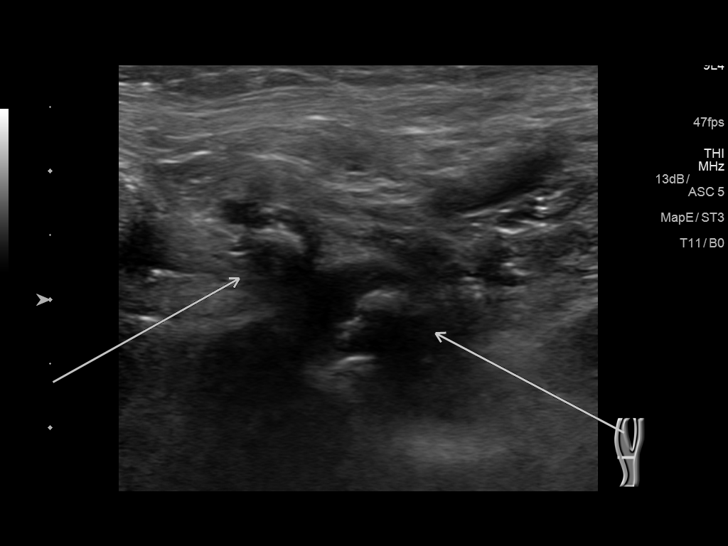
[im 36/104]
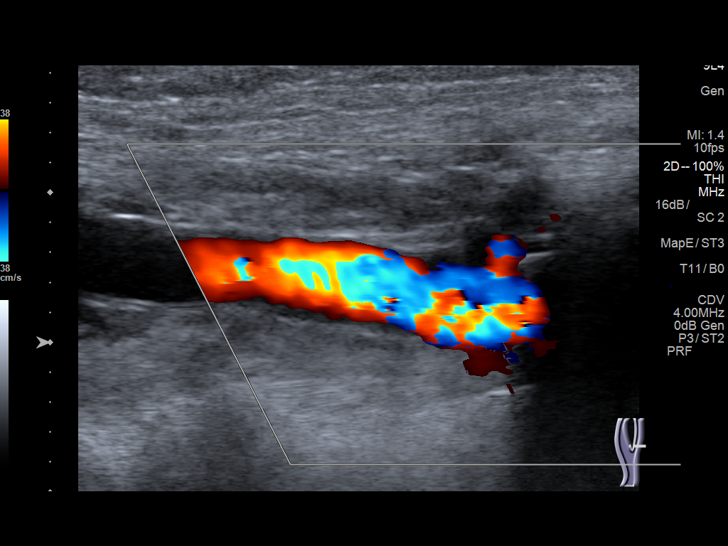
[im 45/104]
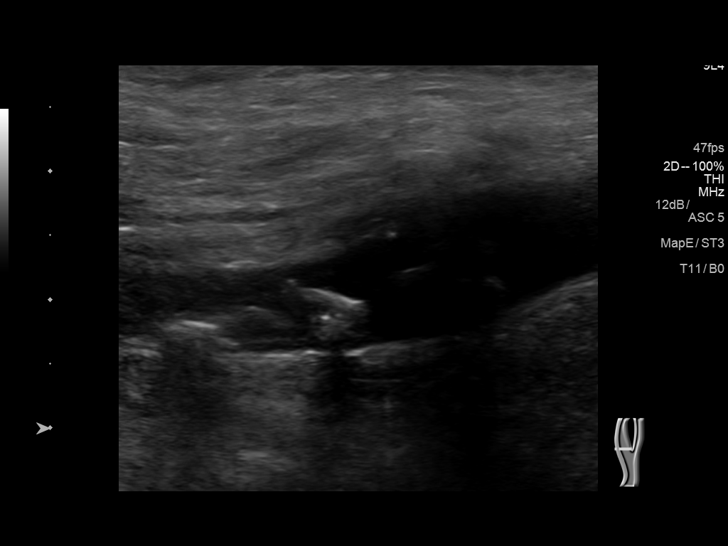
[im 54/104]
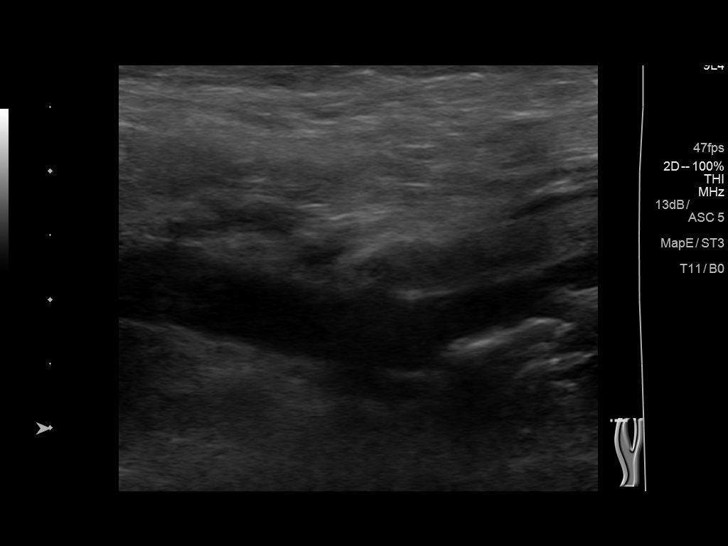
[im 59/104]
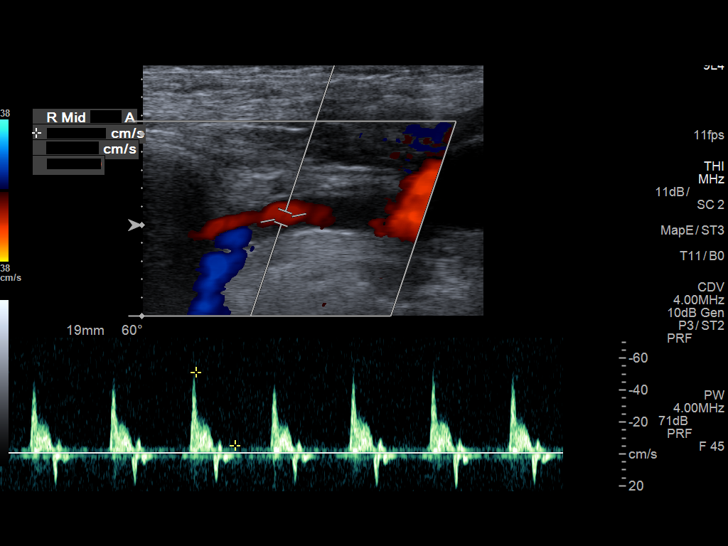
[im 68/104]
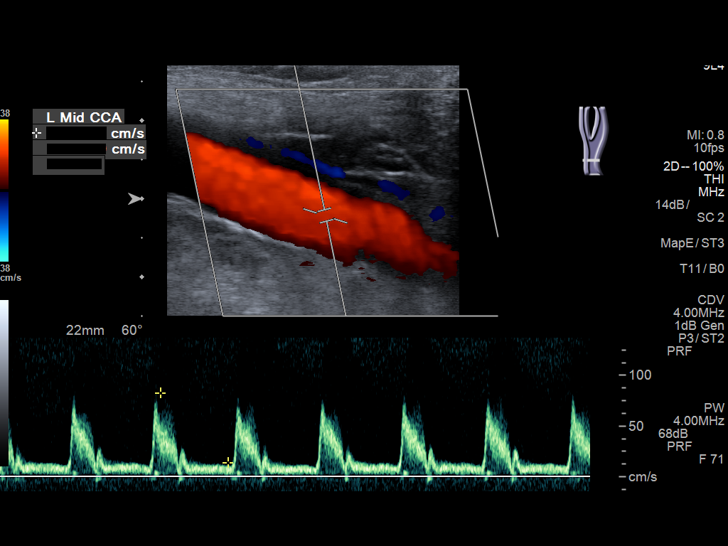
[im 77/104]
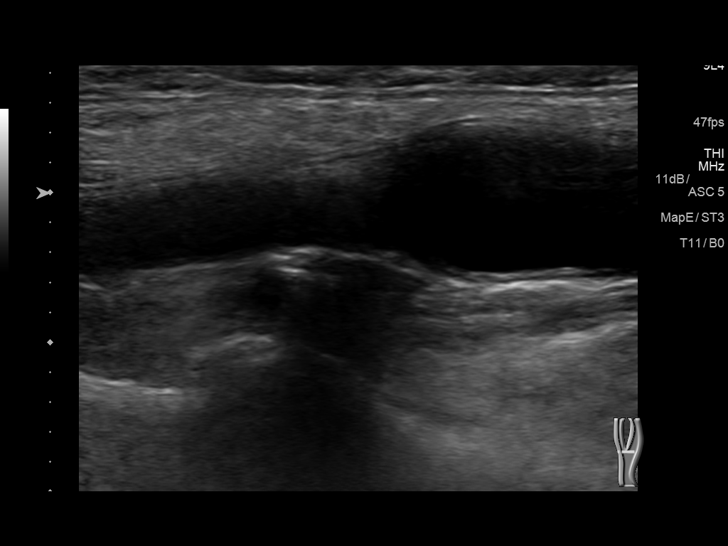
[im 86/104]
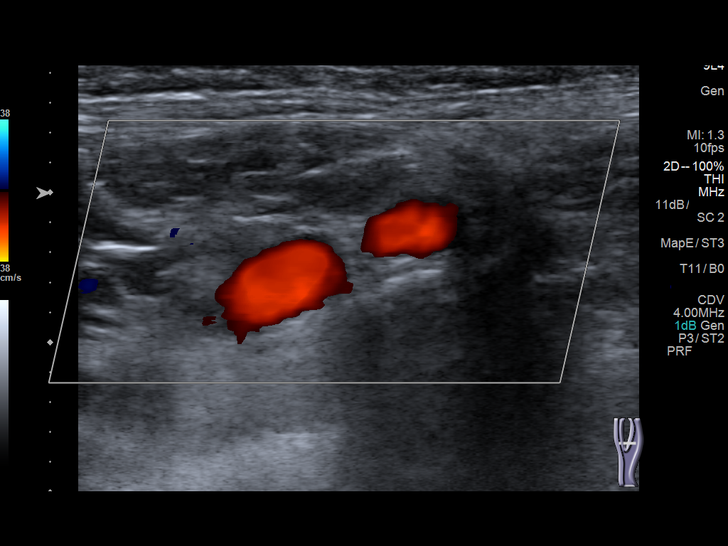
[im 95/104]
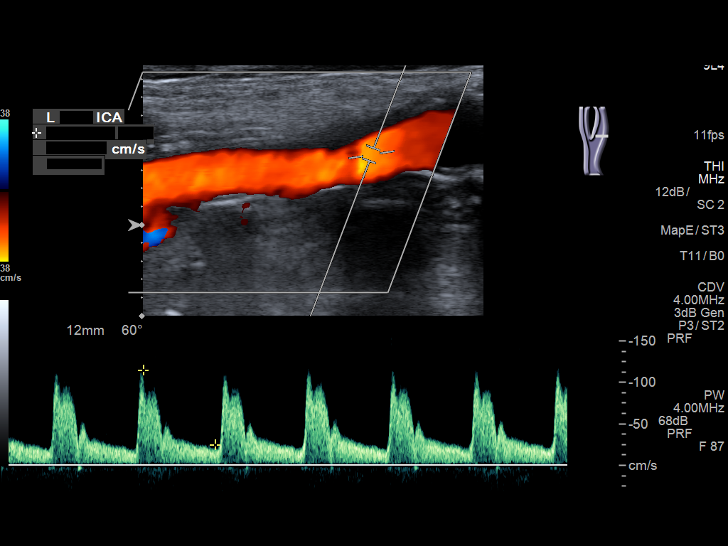
[im 104/104]
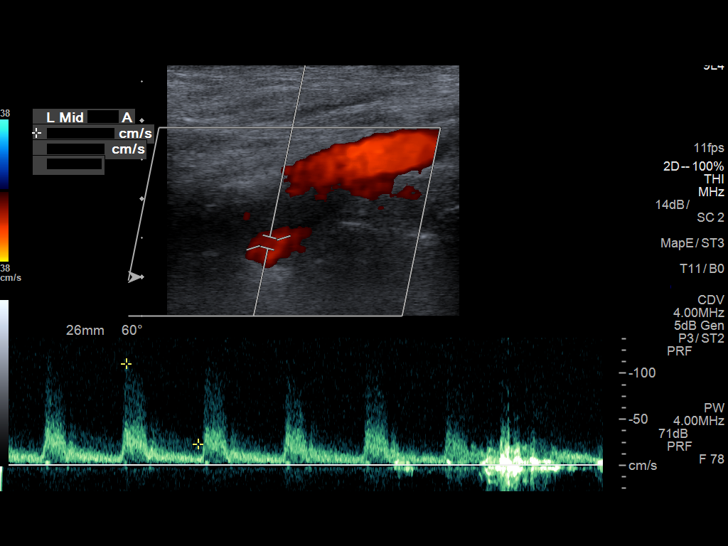

[13 of 24 positions shown; findings below may reference images not displayed]

FINDINGS: Criteria: Quantification of carotid stenosis is based on velocity
parameters that correlate the residual internal carotid diameter
with NASCET-based stenosis levels, using the diameter of the distal
internal carotid lumen as the denominator for stenosis measurement.

The following velocity measurements were obtained:

RIGHT

ICA:  130/32 cm/sec

CCA:  76/13 cm/sec

SYSTOLIC ICA/CCA RATIO:

DIASTOLIC ICA/CCA RATIO:

ECA:  431 cm/sec

LEFT

ICA:  123/31 cm/sec

CCA:  83/13 cm/sec

SYSTOLIC ICA/CCA RATIO:

DIASTOLIC ICA/CCA RATIO:

ECA:  148 cm/sec

RIGHT CAROTID ARTERY: Diffuse right carotid atherosclerotic vascular
disease with moderate atherosclerotic vascular plaque at the right
carotid bifurcation proximal ICA. Degree of stenosis using flow
velocities and visual inspection is in the 50-69% range. Prominent
right external carotid artery stenosis.

RIGHT VERTEBRAL ARTERY:  Patent with antegrade flow.

LEFT CAROTID ARTERY: Diffuse mild atherosclerotic vascular disease
with atherosclerotic vascular plaque of the left carotid bifurcation
and proximal ICA. Degree of stenosis less than 50%.

LEFT VERTEBRAL ARTERY:  Patent with antegrade flow.
IMPRESSION: 1. Diffuse right carotid atherosclerotic vascular disease with
moderate atherosclerotic vascular plaque at the right carotid
bifurcation and proximal ICA. Degree of stenosis using flow
velocities and visual inspection is in the 50-69% range.

2. Diffuse mild atherosclerotic vascular disease with mild left
carotid bifurcation and proximal ICA atherosclerotic vascular
disease. Degree of stenosis less than 50%.

3.  Vertebrals are patent with antegrade flow.

## 2018-05-09 IMAGING — NM NM MYOCAR MULTI W/SPECT W/WALL MOTION & EF
2 series · 12 of 12 positions shown · non-contrast
Comparison: none

[Series 1: rest · 8.28mm/px · 6 of 64 frames shown]
[frame 6/64]
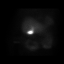
[frame 16/64]
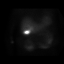
[frame 27/64]
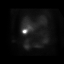
[frame 38/64]
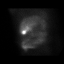
[frame 48/64]
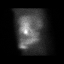
[frame 59/64]
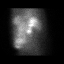

[Series 2: stress gated · 8.28mm/px · 6 of 64 frames shown]
[frame 6/64]
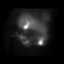
[frame 16/64]
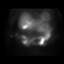
[frame 27/64]
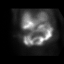
[frame 38/64]
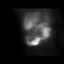
[frame 48/64]
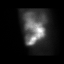
[frame 59/64]
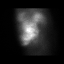

[12 of 12 positions shown; findings below may reference images not displayed]

Canned report from images found in remote index.

Refer to host system for actual result text.
